# Patient Record
Sex: Male | Born: 1975 | Race: Black or African American | Hispanic: No | Marital: Single | Smoking: Never smoker
Health system: Southern US, Community
[De-identification: ages and names within clinical notes are randomized; demographics above are authoritative.]

## PROBLEM LIST (undated history)

## (undated) DIAGNOSIS — W3400XA Accidental discharge from unspecified firearms or gun, initial encounter: Secondary | ICD-10-CM

## (undated) DIAGNOSIS — G8929 Other chronic pain: Secondary | ICD-10-CM

## (undated) DIAGNOSIS — Z789 Other specified health status: Secondary | ICD-10-CM

## (undated) HISTORY — PX: ANKLE SURGERY: SHX546

## (undated) HISTORY — PX: WRIST SURGERY: SHX841

---

## 2018-12-17 ENCOUNTER — Other Ambulatory Visit: Payer: Self-pay

## 2018-12-17 ENCOUNTER — Ambulatory Visit: Payer: Managed Care, Other (non HMO) | Admitting: Nurse Practitioner

## 2018-12-19 ENCOUNTER — Ambulatory Visit (INDEPENDENT_AMBULATORY_CARE_PROVIDER_SITE_OTHER): Payer: Managed Care, Other (non HMO) | Admitting: Nurse Practitioner

## 2018-12-19 ENCOUNTER — Ambulatory Visit: Payer: Managed Care, Other (non HMO) | Admitting: Nurse Practitioner

## 2018-12-19 ENCOUNTER — Other Ambulatory Visit: Payer: Self-pay

## 2018-12-19 ENCOUNTER — Encounter: Payer: Self-pay | Admitting: Nurse Practitioner

## 2018-12-19 VITALS — BP 120/84 | HR 67 | Temp 98.1°F | Ht 75.0 in | Wt 205.2 lb

## 2018-12-19 DIAGNOSIS — M545 Low back pain, unspecified: Secondary | ICD-10-CM

## 2018-12-19 DIAGNOSIS — M25522 Pain in left elbow: Secondary | ICD-10-CM | POA: Diagnosis not present

## 2018-12-19 MED ORDER — DICLOFENAC SODIUM 1 % TD GEL: 2.0000 g | Freq: Three times a day (TID) | TRANSDERMAL | 1 refills | Status: DC | PRN

## 2018-12-19 MED ORDER — NAPROXEN 500 MG PO TABS: 500.0000 mg | ORAL_TABLET | Freq: Two times a day (BID) | ORAL | 0 refills | Status: DC

## 2018-12-19 NOTE — Patient Instructions (Signed)
Alternated between warm and cold compress as needed.  Use elbow brace daily.  Safe Lifting Techniques for Caregivers Being a caregiver can be physically difficult. It can also be dangerous for your back, neck, or shoulders. If you do not use safe lifting techniques, both you and the person you care for could be injured. You have a greater risk of injury when you:  Help a person sit up from lying down.  Help a person get out of bed.  Move a person from a bed to a chair or wheelchair.  Lean over someone for long periods of time. Learn and follow basic lifting techniques to prevent injuries. What are the risks? Without using safe lifting techniques, caregivers are at higher risk of injuring their back, neck, and shoulders. These injuries can be sudden and traumatic, or they can happen over time from repeating the same stressful motion over and over (overuse injuries). Overuse injuries are more common. These happen because muscles and joints do not have time to recover between repeated stressful movements. Using safe lifting techniques can help prevent injuries. General tips Before lifting 1. Stretch and warm up your muscles before lifting or moving. Cold and stiff muscles get injured more easily. 2. Talk to the person about what you are going to do. This may help him or her assist you with the lifting. While lifting 1. Always keep your head, shoulders, and back aligned. Twisting is a major cause of injury. 2. If you need to move while lifting, shift your feet in small steps while keeping your back straight. 3. Do not let a person you are lifting lock arms around your neck. This can make you lose your balance or fall forward, and can place strain on your neck. 4. Always keep your back as straight as possible. Bending at the waist or leaning over is a major cause of back injury. 5. When you are lifting a person or a heavy object, do not lift with your arms extended. Keep the person or object  close to your body. 6. Keep your feet spread shoulder-width apart, with one foot slightly in front of the other. This helps you maintain balance within your center of gravity when lifting. 7. Use the big muscles in your legs and arms instead of your back muscles to lift or pull. Tighten your belly muscles to help support your back. 8. If you have trouble lifting a person, or the person fights you or is stuck in a difficult position, do not try to lift alone. Get help. 9. If you feel pain while lifting, stop and get help. How to help someone sit up, stand up, or sit down Helping someone sit up, stand up, or sit down involves both lifting and turning. These movements require special techniques. Do not bend, twist, or lift with your back muscles. To help someone sit up to get out of bed: 1. Place the chair or wheelchair close to the bed. Make sure the wheelchair wheels are locked. 2. Place one arm under the person's shoulders and the other arm under the person's knees. 3. Keep your feet spread shoulder-width apart and bend at your knees, not your back. 4. Keep your back straight, and turn the person so that their legs come out over the bed and their upper body comes up straight into a sitting position. To help someone stand up from a bed, chair, or wheelchair:     1. Make sure the wheelchair wheels are locked. 2. Position the person's  feet on the floor spread shoulder-width apart. 3. Place the person's hands on the bed or arms of the chair, not around your neck. 4. Place your arms around the person's upper back and clasp your hands together. 5. Hold the person close to you, and lean your weight back. Use your legs while keeping your back straight. Avoid pulling with your back. 6. Consider using a lifting belt around the person's waist to make lifting safer and easier. To help someone sit down:  1. When someone is standing with you, support the person close to your body. 2. Turn to the chair or  wheelchair with small shuffling steps. 3. Keep your back straight. 4. Have the person put both hands on the arms of the chair, wheelchair, or surface they are going to sit on. 5. Lower the person into the chair by bending your knees, not by leaning forward. Contact a health care provider if:  You have pain while moving or lifting.  You have pain after moving or lifting.  You have pain while resting.  You have tingling, numbness, or shooting pain anywhere in your body.  You have bruising, swelling, or stiffness.  You have weakness that makes lifting hard. Summary  Being a caregiver for a person who needs help sitting up, getting out of bed, or moving from a bed to a chair can be physically difficult.  Lifting without proper technique can cause back, neck, and shoulder injuries.  Most injuries happen from bending over to give care, pulling someone up and out of bed, or moving someone from a bed to a chair.  Learn and follow basic lifting techniques to prevent injuries.  Do not bend, twist, or lift with your back muscles. This information is not intended to replace advice given to you by your health care provider. Make sure you discuss any questions you have with your health care provider. Document Released: 09/16/2018 Document Revised: 09/16/2018 Document Reviewed: 09/16/2018 Elsevier Patient Education  2020 [employer].

## 2018-12-19 NOTE — Progress Notes (Signed)
Subjective:  Patient ID: Tyler AddisonDwayne Rowland, male    DOB: 1976/03/03  Age: 43 y.o. MRN: [id number]  CC: Establish Care (est care/pt is c/o of lower left back pain when turn certain way,left arm painful when,worse when make a fis/ 1 mo/ibuprofen/ saw dcotor at [employer] for this before--his work consist of lifting heavy duties )  Back Pain This is a new problem. The current episode started 1 to 4 weeks ago. The problem occurs intermittently. The problem has been waxing and waning since onset. The pain is present in the lumbar spine. The quality of the pain is described as aching. The pain does not radiate. The symptoms are aggravated by bending and twisting. Pertinent negatives include no abdominal pain, bladder incontinence, bowel incontinence, dysuria, fever, leg pain, numbness, paresis, paresthesias, pelvic pain, perianal numbness, tingling, weakness or weight loss. Risk factors include poor posture (repititive bending and lifting at [employer]). He has tried NSAIDs for the symptoms. The treatment provided mild relief.  Arm Pain  Incident onset: 2months ago. Injury mechanism: repititive movement (lifting heavy objects at [facility] [employer]) The pain is present in the left forearm and left elbow. The quality of the pain is described as aching. The pain radiates to the left arm. The pain has been constant since the incident. Pertinent negatives include no muscle weakness, numbness or tingling. The symptoms are aggravated by movement and palpation. He has tried NSAIDs for the symptoms. The treatment provided mild relief.   Reviewed past Medical, Surgical, Social and Family history today.  No outpatient medications prior to visit.   No facility-administered medications prior to visit.     ROS See HPI  Objective:  BP 120/84   Pulse 67   Temp 98.1 F (36.7 C) (Oral)   Ht 6\' 3"  (1.905 m)   Wt 205 lb 3.2 oz (93.1 kg)   SpO2 96%   BMI 25.65 kg/m   BP  Readings from Last 3 Encounters:  12/19/18 120/84    Wt Readings from Last 3 Encounters:  12/19/18 205 lb 3.2 oz (93.1 kg)    Physical Exam Vitals signs reviewed.  Neck:     Musculoskeletal: Normal range of motion and neck supple.  Cardiovascular:     Rate and Rhythm: Normal rate.     Pulses: Normal pulses.  Musculoskeletal: Normal range of motion.        General: Tenderness present. No swelling.     Left elbow: He exhibits normal range of motion, no swelling, no effusion, no deformity and no laceration. Tenderness found. Lateral epicondyle tenderness noted. No radial head, no medial epicondyle and no olecranon process tenderness noted.     Left wrist: Normal.       Back:     Left forearm: He exhibits tenderness. He exhibits no bony tenderness, no swelling, no edema and no deformity.       Arms:     Left hand: Normal.     Right lower leg: No edema.     Left lower leg: No edema.  Skin:    General: Skin is warm and dry.  Neurological:     Mental Status: He is alert and oriented to person, place, and time.    No results found for: WBC, HGB, HCT, PLT, GLUCOSE, CHOL, TRIG, HDL, LDLDIRECT, LDLCALC, ALT, AST, NA, K, CL, CREATININE, BUN, CO2, TSH, PSA, INR, GLUF, HGBA1C, MICROALBUR  Assessment & Plan:   Tyler was seen today for establish care.  Diagnoses and  all orders for this visit:  Acute left-sided low back pain without sciatica -     diclofenac sodium (VOLTAREN) 1 % GEL; Apply 2 g topically 3 (three) times daily as needed. -     naproxen (NAPROSYN) 500 MG tablet; Take 1 tablet (500 mg total) by mouth 2 (two) times daily with a meal.  Left elbow pain -     diclofenac sodium (VOLTAREN) 1 % GEL; Apply 2 g topically 3 (three) times daily as needed. -     naproxen (NAPROSYN) 500 MG tablet; Take 1 tablet (500 mg total) by mouth 2 (two) times daily with a meal.   I am having Tyler Londo start on diclofenac sodium and naproxen.  Meds ordered this encounter  Medications   . diclofenac sodium (VOLTAREN) 1 % GEL    Sig: Apply 2 g topically 3 (three) times daily as needed.    Dispense:  100 g    Refill:  1    Order Specific Question:   Supervising Provider    Answer:   MATTHEWS, CODY [[id number]]  . naproxen (NAPROSYN) 500 MG tablet    Sig: Take 1 tablet (500 mg total) by mouth 2 (two) times daily with a meal.    Dispense:  30 tablet    Refill:  0    Order Specific Question:   Supervising Provider    Answer:   MATTHEWS, CODY [[id number]]    Problem List Items Addressed This Visit    None    Visit Diagnoses    Acute left-sided low back pain without sciatica    -  Primary   Relevant Medications   diclofenac sodium (VOLTAREN) 1 % GEL   naproxen (NAPROSYN) 500 MG tablet   Left elbow pain       Relevant Medications   diclofenac sodium (VOLTAREN) 1 % GEL   naproxen (NAPROSYN) 500 MG tablet       Follow-up: Return in about 2 weeks (around 01/02/2019) for CPE (fasting).  Wilfred Lacy, NP

## 2018-12-31 ENCOUNTER — Ambulatory Visit (INDEPENDENT_AMBULATORY_CARE_PROVIDER_SITE_OTHER): Payer: Managed Care, Other (non HMO) | Admitting: Nurse Practitioner

## 2018-12-31 ENCOUNTER — Other Ambulatory Visit: Payer: Self-pay

## 2018-12-31 ENCOUNTER — Encounter: Payer: Self-pay | Admitting: Nurse Practitioner

## 2018-12-31 VITALS — BP 126/86 | HR 70 | Temp 98.1°F | Ht 75.0 in | Wt 211.6 lb

## 2018-12-31 DIAGNOSIS — Z114 Encounter for screening for human immunodeficiency virus [HIV]: Secondary | ICD-10-CM | POA: Diagnosis not present

## 2018-12-31 DIAGNOSIS — Z136 Encounter for screening for cardiovascular disorders: Secondary | ICD-10-CM

## 2018-12-31 DIAGNOSIS — Z0000 Encounter for general adult medical examination without abnormal findings: Secondary | ICD-10-CM | POA: Diagnosis not present

## 2018-12-31 DIAGNOSIS — Z125 Encounter for screening for malignant neoplasm of prostate: Secondary | ICD-10-CM

## 2018-12-31 DIAGNOSIS — Z13220 Encounter for screening for lipoid disorders: Secondary | ICD-10-CM

## 2018-12-31 LAB — COMPREHENSIVE METABOLIC PANEL
ALT: 15 U/L (ref 0–53)
AST: 15 U/L (ref 0–37)
Albumin: 4.4 g/dL (ref 3.5–5.2)
Alkaline Phosphatase: 59 U/L (ref 39–117)
BUN: 10 mg/dL (ref 6–23)
CO2: 30 mEq/L (ref 19–32)
Calcium: 9.4 mg/dL (ref 8.4–10.5)
Chloride: 105 mEq/L (ref 96–112)
Creatinine, Ser: 1.21 mg/dL (ref 0.40–1.50)
GFR: 78.5 mL/min (ref >60.00)
Glucose, Bld: 99 mg/dL (ref 70–99)
Potassium: 4.5 mEq/L (ref 3.5–5.1)
Sodium: 140 mEq/L (ref 135–145)
Total Bilirubin: 0.3 mg/dL (ref 0.2–1.2)
Total Protein: 6.8 g/dL (ref 6.0–8.3)

## 2018-12-31 LAB — CBC
HCT: 43.7 % (ref 39.0–52.0)
Hemoglobin: 14.4 g/dL (ref 13.0–17.0)
MCHC: 32.9 g/dL (ref 30.0–36.0)
MCV: 90.8 fl (ref 78.0–100.0)
Platelets: 237 10*3/uL (ref 150.0–400.0)
RBC: 4.81 Mil/uL (ref 4.22–5.81)
RDW: 13.6 % (ref 11.5–15.5)
WBC: 3.3 10*3/uL — ABNORMAL LOW (ref 4.0–10.5)

## 2018-12-31 LAB — LIPID PANEL
Cholesterol: 201 mg/dL — ABNORMAL HIGH (ref 0–200)
HDL: 49.2 mg/dL (ref >39.00)
LDL Cholesterol: 139 mg/dL — ABNORMAL HIGH (ref 0–99)
NonHDL: 151.47
Total CHOL/HDL Ratio: 4
Triglycerides: 63 mg/dL (ref 0.0–149.0)
VLDL: 12.6 mg/dL (ref 0.0–40.0)

## 2018-12-31 LAB — PSA: PSA: 0.92 ng/mL (ref 0.10–4.00)

## 2018-12-31 LAB — TSH: TSH: 1.07 u[IU]/mL (ref 0.35–4.50)

## 2018-12-31 NOTE — Progress Notes (Signed)
Subjective:    Patient ID: Tyler AddisonDwayne Rowland, male    DOB: [birth year], 43 y.o.   MRN: [id number]  Patient presents today for complete physical  HPI  Sexual History (orientation,birth control, marital status, STD):single, sexually active  Depression/Suicide: [facility] screen [facility] 12/19/2018  Decreased Interest 0  Down, Depressed, Hopeless 0  PHQ - 2 Score 0   Vision:not needed per patient  Dental:will schedule  Immunizations: (TDAP, Hep C screen, Pneumovax, Influenza, zoster)  Health Maintenance  Topic Date Due  . HIV Screening  02/07/1989  . Flu Shot  01/10/2019  . Tetanus Vaccine  01/09/2026   Diet:regular.  Weight:  Wt Readings from Last 3 Encounters:  12/31/18 211 lb 9.6 oz (96 kg)  12/19/18 205 lb 3.2 oz (93.1 kg)    Exercise:none outside of lifting/pushing/walking at current job ([employer])  Fall Risk: Fall Risk  12/19/2018  Falls in the past year? 0   Medications and allergies reviewed with patient and updated if appropriate.  There are no active problems to display for this patient.   Current Outpatient Medications on File Prior to Visit  Medication Sig Dispense Refill  . diclofenac sodium (VOLTAREN) 1 % GEL Apply 2 g topically 3 (three) times daily as needed. 100 g 1  . naproxen (NAPROSYN) 500 MG tablet Take 1 tablet (500 mg total) by mouth 2 (two) times daily with a meal. 30 tablet 0   No current facility-administered medications on file prior to visit.     History reviewed. No pertinent past medical history.  Past Surgical History:  Procedure Laterality Date  . ANKLE SURGERY Left   . WRIST SURGERY Right     Social History   Socioeconomic History  . Marital status: Single    Spouse name: Not on file  . Number of children: Not on file  . Years of education: Not on file  . Highest education level: Not on file  Occupational History  . Not on file  Social Needs  . Financial resource strain: Not on file  . Food insecurity    Worry: Not on file     Inability: Not on file  . Transportation needs    Medical: Not on file    Non-medical: Not on file  Tobacco Use  . Smoking status: Never Smoker  . Smokeless tobacco: Never Used  Substance and Sexual Activity  . Alcohol use: Yes    Comment: social   . Drug use: Never  . Sexual activity: Yes    Birth control/protection: None  Lifestyle  . Physical activity    Days per week: Not on file    Minutes per session: Not on file  . Stress: Not on file  Relationships  . Social Musicianconnections    Talks on phone: Not on file    Gets together: Not on file    Attends religious service: Not on file    Active member of club or organization: Not on file    Attends meetings of clubs or organizations: Not on file    Relationship status: Not on file  Other Topics Concern  . Not on file  Social History Narrative  . Not on file    History reviewed. No pertinent family history.      Review of Systems  Constitutional: Negative for fever, malaise/fatigue and weight loss.  HENT: Negative for congestion and sore throat.   Eyes:       Negative for visual changes  Respiratory: Negative for cough and shortness of  breath.   Cardiovascular: Negative for chest pain, palpitations and leg swelling.  Gastrointestinal: Negative for blood in stool, constipation, diarrhea and heartburn.  Genitourinary: Negative for dysuria, frequency and urgency.  Musculoskeletal: Negative for falls, joint pain and myalgias.  Skin: Negative for rash.  Neurological: Negative for dizziness, sensory change and headaches.  Endo/Heme/Allergies: Does not bruise/bleed easily.  Psychiatric/Behavioral: Negative for depression, substance abuse and suicidal ideas. The patient is not nervous/anxious.     Objective:   Vitals:   12/31/18 0912  BP: 126/86  Pulse: 70  Temp: 98.1 F (36.7 C)  SpO2: 96%    Body mass index is 26.45 kg/m.   Physical Examination:  Physical Exam Vitals signs reviewed.  Constitutional:       Appearance: Normal appearance. He is well-developed.  HENT:     Head: Normocephalic.     Right Ear: Tympanic membrane, ear canal and external ear normal.     Left Ear: Tympanic membrane, ear canal and external ear normal.     Nose: Nose normal.     Mouth/Throat:     Pharynx: No oropharyngeal exudate.  Eyes:     Extraocular Movements: Extraocular movements intact.     Conjunctiva/sclera: Conjunctivae normal.  Neck:     Musculoskeletal: Normal range of motion and neck supple.  Cardiovascular:     Rate and Rhythm: Normal rate and regular rhythm.     Heart sounds: Normal heart sounds.  Pulmonary:     Effort: Pulmonary effort is normal. No respiratory distress.     Breath sounds: Normal breath sounds.  Chest:     Chest wall: No tenderness.  Abdominal:     General: Abdomen is flat. Bowel sounds are normal.     Palpations: Abdomen is soft.  Genitourinary:    Comments: declined Musculoskeletal: Normal range of motion.  Skin:    General: Skin is warm and dry.  Neurological:     Mental Status: He is alert and oriented to person, place, and time.     Deep Tendon Reflexes: Reflexes are normal and symmetric.  Psychiatric:        Mood and Affect: Mood normal.        Behavior: Behavior normal.        Thought Content: Thought content normal.     ASSESSMENT and PLAN:  Tyler was seen today for annual exam.  Diagnoses and all orders for this visit:  Encounter for preventative adult health care examination -     TSH -     CBC -     Lipid panel -     HIV Antibody (routine testing w rflx) -     Comprehensive metabolic panel -     PSA  Encounter for lipid screening for cardiovascular disease -     Lipid panel  Prostate cancer screening -     PSA  Encounter for screening for human immunodeficiency virus (HIV) -     HIV Antibody (routine testing w rflx)   No problem-specific Assessment & Plan notes found for this encounter.     Problem List Items Addressed This Visit     None    Visit Diagnoses    Encounter for preventative adult health care examination    -  Primary   Relevant Orders   TSH (Completed)   CBC (Completed)   Lipid panel (Completed)   HIV Antibody (routine testing w rflx)   Comprehensive metabolic panel (Completed)   PSA (Completed)   Encounter for lipid screening for  cardiovascular disease       Relevant Orders   Lipid panel (Completed)   Prostate cancer screening       Relevant Orders   PSA (Completed)   Encounter for screening for human immunodeficiency virus (HIV)       Relevant Orders   HIV Antibody (routine testing w rflx)       Follow up: Return if symptoms worsen or fail to improve.  Wilfred Lacy, NP

## 2018-12-31 NOTE — Patient Instructions (Signed)
Use naproxen as prescribed.  Schedule dental cleaning every 42months. Schedule eye exam every 1-14yrs.  Go to lab for blood draw.   Health Maintenance, Male Adopting a healthy lifestyle and getting preventive care are important in promoting health and wellness. Ask your health care provider about:  The right schedule for you to have regular tests and exams.  Things you can do on your own to prevent diseases and keep yourself healthy. What should I know about diet, weight, and exercise? Eat a healthy diet   Eat a diet that includes plenty of vegetables, fruits, low-fat dairy products, and lean protein.  Do not eat a lot of foods that are high in solid fats, added sugars, or sodium. Maintain a healthy weight Body mass index (BMI) is a measurement that can be used to identify possible weight problems. It estimates body fat based on height and weight. Your health care provider can help determine your BMI and help you achieve or maintain a healthy weight. Get regular exercise Get regular exercise. This is one of the most important things you can do for your health. Most adults should:  Exercise for at least 150 minutes each week. The exercise should increase your heart rate and make you sweat (moderate-intensity exercise).  Do strengthening exercises at least twice a week. This is in addition to the moderate-intensity exercise.  Spend less time sitting. Even light physical activity can be beneficial. Watch cholesterol and blood lipids Have your blood tested for lipids and cholesterol at 45 years of age, then have this test every 5 years. You may need to have your cholesterol levels checked more often if:  Your lipid or cholesterol levels are high.  You are older than 45 years of age.  You are at high risk for heart disease. What should I know about cancer screening? Many types of cancers can be detected early and may often be prevented. Depending on your health history and family  history, you may need to have cancer screening at various ages. This may include screening for:  Colorectal cancer.  Prostate cancer.  Skin cancer.  Lung cancer. What should I know about heart disease, diabetes, and high blood pressure? Blood pressure and heart disease  High blood pressure causes heart disease and increases the risk of stroke. This is more likely to develop in people who have high blood pressure readings, are of African descent, or are overweight.  Talk with your health care provider about your target blood pressure readings.  Have your blood pressure checked: ? Every 3-5 years if you are 39-67 years of age. ? Every year if you are 56 years old or older.  If you are between the ages of 29 and 28 and are a current or former smoker, ask your health care provider if you should have a one-time screening for abdominal aortic aneurysm (AAA). Diabetes Have regular diabetes screenings. This checks your fasting blood sugar level. Have the screening done:  Once every three years after age 82 if you are at a normal weight and have a low risk for diabetes.  More often and at a younger age if you are overweight or have a high risk for diabetes. What should I know about preventing infection? Hepatitis B If you have a higher risk for hepatitis B, you should be screened for this virus. Talk with your health care provider to find out if you are at risk for hepatitis B infection. Hepatitis C Blood testing is recommended for:  Everyone born  from [place] through 1967.  Anyone with known risk factors for hepatitis C. Sexually transmitted infections (STIs)  You should be screened each year for STIs, including gonorrhea and chlamydia, if: ? You are sexually active and are younger than 45 years of age. ? You are older than 45 years of age and your health care provider tells you that you are at risk for this type of infection. ? Your sexual activity has changed since you were last  screened, and you are at increased risk for chlamydia or gonorrhea. Ask your health care provider if you are at risk.  Ask your health care provider about whether you are at high risk for HIV. Your health care provider may recommend a prescription medicine to help prevent HIV infection. If you choose to take medicine to prevent HIV, you should first get tested for HIV. You should then be tested every 3 months for as long as you are taking the medicine. Follow these instructions at home: Lifestyle  Do not use any products that contain nicotine or tobacco, such as cigarettes, e-cigarettes, and chewing tobacco. If you need help quitting, ask your health care provider.  Do not use street drugs.  Do not share needles.  Ask your health care provider for help if you need support or information about quitting drugs. Alcohol use  Do not drink alcohol if your health care provider tells you not to drink.  If you drink alcohol: ? Limit how much you have to 0-2 drinks a day. ? Be aware of how much alcohol is in your drink. In the [place] one drink equals one 12 oz bottle of beer (355 mL), one 5 oz glass of wine (148 mL), or one 1 oz glass of hard liquor (44 mL). General instructions  Schedule regular health, dental, and eye exams.  Stay current with your vaccines.  Tell your health care provider if: ? You often feel depressed. ? You have ever been abused or do not feel safe at home. Summary  Adopting a healthy lifestyle and getting preventive care are important in promoting health and wellness.  Follow your health care provider's instructions about healthy diet, exercising, and getting tested or screened for diseases.  Follow your health care provider's instructions on monitoring your cholesterol and blood pressure. This information is not intended to replace advice given to you by your health care provider. Make sure you discuss any questions you have with your health care provider. Document  Released: 11/24/2007 Document Revised: 05/21/2018 Document Reviewed: 05/21/2018 Elsevier Patient Education  2020 [employer].

## 2019-01-01 LAB — HIV ANTIBODY (ROUTINE TESTING W REFLEX): HIV 1&2 Ab, 4th Generation: NONREACTIVE

## 2019-02-06 ENCOUNTER — Encounter (HOSPITAL_COMMUNITY): Payer: Self-pay

## 2019-02-06 ENCOUNTER — Emergency Department (HOSPITAL_COMMUNITY): Payer: Managed Care, Other (non HMO)

## 2019-02-06 ENCOUNTER — Other Ambulatory Visit: Payer: Self-pay

## 2019-02-06 ENCOUNTER — Inpatient Hospital Stay (HOSPITAL_COMMUNITY)
Admission: EM | Admit: 2019-02-06 | Discharge: 2019-02-11 | DRG: 907 | Disposition: A | Discharge status: Home / Self Care | Payer: Managed Care, Other (non HMO) | Attending: Vascular Surgery | Admitting: Vascular Surgery

## 2019-02-06 DIAGNOSIS — S75012A Minor laceration of femoral artery, left leg, initial encounter: Secondary | ICD-10-CM | POA: Diagnosis not present

## 2019-02-06 DIAGNOSIS — Z20828 Contact with and (suspected) exposure to other viral communicable diseases: Secondary | ICD-10-CM | POA: Diagnosis present

## 2019-02-06 DIAGNOSIS — Y92810 Car as the place of occurrence of the external cause: Secondary | ICD-10-CM

## 2019-02-06 DIAGNOSIS — D62 Acute posthemorrhagic anemia: Secondary | ICD-10-CM | POA: Diagnosis present

## 2019-02-06 DIAGNOSIS — S71132A Puncture wound without foreign body, left thigh, initial encounter: Secondary | ICD-10-CM | POA: Diagnosis not present

## 2019-02-06 DIAGNOSIS — I9589 Other hypotension: Secondary | ICD-10-CM

## 2019-02-06 DIAGNOSIS — Y92410 Unspecified street and highway as the place of occurrence of the external cause: Secondary | ICD-10-CM

## 2019-02-06 DIAGNOSIS — S85002A Unspecified injury of popliteal artery, left leg, initial encounter: Secondary | ICD-10-CM

## 2019-02-06 DIAGNOSIS — S75112A Minor laceration of femoral vein at hip and thigh level, left leg, initial encounter: Secondary | ICD-10-CM | POA: Diagnosis present

## 2019-02-06 DIAGNOSIS — I959 Hypotension, unspecified: Secondary | ICD-10-CM | POA: Diagnosis present

## 2019-02-06 DIAGNOSIS — W3400XA Accidental discharge from unspecified firearms or gun, initial encounter: Secondary | ICD-10-CM

## 2019-02-06 HISTORY — DX: Other specified health status: Z78.9

## 2019-02-06 LAB — COMPREHENSIVE METABOLIC PANEL
ALT: 17 U/L (ref 0–44)
AST: 23 U/L (ref 15–41)
Albumin: 3.7 g/dL (ref 3.5–5.0)
Alkaline Phosphatase: 64 U/L (ref 38–126)
Anion gap: 12 (ref 5–15)
BUN: 9 mg/dL (ref 6–20)
CO2: 19 mmol/L — ABNORMAL LOW (ref 22–32)
Calcium: 8.5 mg/dL — ABNORMAL LOW (ref 8.9–10.3)
Chloride: 108 mmol/L (ref 98–111)
Creatinine, Ser: 1.63 mg/dL — ABNORMAL HIGH (ref 0.61–1.24)
GFR calc Af Amer: 59 mL/min — ABNORMAL LOW (ref >60)
GFR calc non Af Amer: 50 mL/min — ABNORMAL LOW (ref >60)
Glucose, Bld: 165 mg/dL — ABNORMAL HIGH (ref 70–99)
Potassium: 4.4 mmol/L (ref 3.5–5.1)
Sodium: 139 mmol/L (ref 135–145)
Total Bilirubin: 0.7 mg/dL (ref 0.3–1.2)
Total Protein: 6.3 g/dL — ABNORMAL LOW (ref 6.5–8.1)

## 2019-02-06 LAB — BPAM FFP
Blood Product Expiration Date: 202008312359
Blood Product Expiration Date: 202008312359
ISSUE DATE / TIME: 202008282024
ISSUE DATE / TIME: 202008282024
Unit Type and Rh: 8400
Unit Type and Rh: 8400

## 2019-02-06 LAB — PREPARE FRESH FROZEN PLASMA: Unit division: 0

## 2019-02-06 LAB — CBC
HCT: 39.8 % (ref 39.0–52.0)
Hemoglobin: 13 g/dL (ref 13.0–17.0)
MCH: 29.6 pg (ref 26.0–34.0)
MCHC: 32.7 g/dL (ref 30.0–36.0)
MCV: 90.7 fL (ref 80.0–100.0)
Platelets: 310 10*3/uL (ref 150–400)
RBC: 4.39 MIL/uL (ref 4.22–5.81)
RDW: 12.3 % (ref 11.5–15.5)
WBC: 4.3 10*3/uL (ref 4.0–10.5)
nRBC: 0 % (ref 0.0–0.2)

## 2019-02-06 LAB — I-STAT CHEM 8, ED
BUN: 9 mg/dL (ref 6–20)
Calcium, Ion: 1.08 mmol/L — ABNORMAL LOW (ref 1.15–1.40)
Chloride: 109 mmol/L (ref 98–111)
Creatinine, Ser: 1.6 mg/dL — ABNORMAL HIGH (ref 0.61–1.24)
Glucose, Bld: 159 mg/dL — ABNORMAL HIGH (ref 70–99)
HCT: 39 % (ref 39.0–52.0)
Hemoglobin: 13.3 g/dL (ref 13.0–17.0)
Potassium: 4.3 mmol/L (ref 3.5–5.1)
Sodium: 142 mmol/L (ref 135–145)
TCO2: 20 mmol/L — ABNORMAL LOW (ref 22–32)

## 2019-02-06 LAB — PROTIME-INR
INR: 1.1 (ref 0.8–1.2)
Prothrombin Time: 13.7 seconds (ref 11.4–15.2)

## 2019-02-06 LAB — CDS SEROLOGY

## 2019-02-06 LAB — ABO/RH: ABO/RH(D): A POS

## 2019-02-06 LAB — ETHANOL: Alcohol, Ethyl (B): <10 mg/dL (ref <10)

## 2019-02-06 MED ORDER — IOHEXOL 350 MG/ML SOLN
100.0000 mL | Freq: Once | INTRAVENOUS | Status: AC | PRN
  Administered 2019-02-06: 100 mL via INTRAVENOUS

## 2019-02-06 MED ORDER — CEFAZOLIN SODIUM-DEXTROSE 2-4 GM/100ML-% IV SOLN
2.0000 g | Freq: Once | INTRAVENOUS | Status: AC
  Administered 2019-02-06: 21:00:00 2 g via INTRAVENOUS
  Filled 2019-02-06: qty 100

## 2019-02-06 MED ORDER — CEPHALEXIN 500 MG PO CAPS: 500.0000 mg | ORAL_CAPSULE | Freq: Three times a day (TID) | ORAL | 0 refills | Status: DC

## 2019-02-06 MED ORDER — TETANUS-DIPHTH-ACELL PERTUSSIS 5-2.5-18.5 LF-MCG/0.5 IM SUSP
0.5000 mL | Freq: Once | INTRAMUSCULAR | Status: AC
  Administered 2019-02-06: 21:00:00 0.5 mL via INTRAMUSCULAR
  Filled 2019-02-06: qty 0.5

## 2019-02-06 MED ORDER — ONDANSETRON HCL 4 MG/2ML IJ SOLN
4.0000 mg | Freq: Once | INTRAMUSCULAR | Status: AC
  Administered 2019-02-06: 4 mg via INTRAVENOUS
  Filled 2019-02-06: qty 2

## 2019-02-06 MED ORDER — SODIUM CHLORIDE 0.9 % IV BOLUS
1000.0000 mL | Freq: Once | INTRAVENOUS | Status: AC
  Administered 2019-02-06: 1000 mL via INTRAVENOUS

## 2019-02-06 MED ORDER — HYDROMORPHONE HCL 1 MG/ML IJ SOLN
1.0000 mg | Freq: Once | INTRAMUSCULAR | Status: AC
  Administered 2019-02-06: 1 mg via INTRAVENOUS
  Filled 2019-02-06: qty 1

## 2019-02-06 MED ORDER — MORPHINE SULFATE (PF) 4 MG/ML IV SOLN
4.0000 mg | Freq: Once | INTRAVENOUS | Status: AC
  Administered 2019-02-06: 4 mg via INTRAVENOUS
  Filled 2019-02-06: qty 1

## 2019-02-06 NOTE — ED Notes (Signed)
Need to collect lactic acid plasma 
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
## 2019-02-06 NOTE — ED Provider Notes (Signed)
At signout, f/u on Ct imaging If negative he can be discharged If abnormal, consult trauma    Ripley Fraise, MD 02/06/19 2340
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
## 2019-02-06 NOTE — Discharge Instructions (Addendum)
° °[facility] ° °Discharge instructions ° °Lower Extremity Bypass Surgery ° °Please refer to the following instruction for your post-procedure care. Your surgeon or physician assistant will discuss any changes with you. ° °Activity ° °You are encouraged to walk as much as you can. You can slowly return to normal activities during the month after your surgery. Avoid strenuous activity and heavy lifting until your doctor tells you it's OK. Avoid activities such as vacuuming or swinging a golf club. Do not drive until your doctor give the OK and you are no longer taking prescription pain medications. It is also normal to have difficulty with sleep habits, eating and bowel movement after surgery. These will go away with time. ° °Bathing/Showering ° °Shower daily after you go home. Do not soak in a bathtub, hot tub, or swim until the incision heals completely. ° °Incision Care ° °Clean your incision with mild soap and water. Shower every day. Pat the area dry with a clean towel. You do not need a bandage unless otherwise instructed. Do not apply any ointments or creams to your incision. If you have open wounds you will be instructed how to care for them or a visiting nurse may be arranged for you. If you have staples or sutures along your incision they will be removed at your post-op appointment. You may have skin glue on your incision. Do not peel it off. It will come off on its own in about one week. ° °Wash the groin wound with soap and water daily and pat dry. (No tub bath-only shower)  Then put a dry gauze or washcloth in the groin to keep this area dry to help prevent wound infection.  Do this daily and as needed.  Do not use Vaseline or neosporin on your incisions.  Only use soap and water on your incisions and then protect and keep dry. ° °Diet ° °Resume your normal diet. There are no special food restrictions following this procedure. A low fat/ low cholesterol diet is  recommended for all patients with vascular disease. In order to heal from your surgery, it is CRITICAL to get adequate nutrition. Your body requires vitamins, minerals, and protein. Vegetables are the best source of vitamins and minerals. Vegetables also provide the perfect balance of protein. Processed food has little nutritional value, so try to avoid this. ° °Medications ° °Resume taking all your medications unless your doctor or physician assistant tells you not to. If your incision is causing pain, you may take over-the-counter pain relievers such as acetaminophen (Tylenol). If you were prescribed a stronger pain medication, please aware these medication can cause nausea and constipation. Prevent nausea by taking the medication with a snack or meal. Avoid constipation by drinking plenty of fluids and eating foods with high amount of fiber, such as fruits, vegetables, and grains. Take Colace 100 mg (an over-the-counter stool softener) twice a day as needed for constipation.  °Do not take Tylenol if you are taking prescription pain medications. ° °Follow Up ° °Our office will schedule a follow up appointment 2-3 weeks following discharge. ° °Please call us immediately for any of the following conditions ° °•Severe or worsening pain in your legs or feet while at rest or while walking •Increase pain, redness, warmth, or drainage (pus) from your incision site(s) °Fever of 101 degree or higher °The swelling in your leg with the bypass suddenly worsens and becomes more painful than when you were in the hospital °If you have   been instructed to feel your graft pulse then you should do so every day. If you can no longer feel this pulse, call the office immediately. Not all patients are given this instruction.  Leg swelling is common after leg bypass surgery.  The swelling should improve over a few months following surgery. To improve the swelling, you may elevate your legs above the level of your heart while you are  sitting or resting. Your surgeon or physician assistant may ask you to apply an ACE wrap or wear compression (TED) stockings to help to reduce swelling.  Reduce your risk of vascular disease  Stop smoking. If you would like help call [facility] at 1-800-QUIT-NOW 782-459-9054) or [facility] at [phone].  Manage your cholesterol Maintain a desired weight Control your diabetes weight Control your diabetes Keep your blood pressure down  If you have any questions, please call the office at [phone]  Information on my medicine - XARELTO (rivaroxaban)  This medication education was reviewed with me or my healthcare representative as part of my discharge preparation.  The pharmacist that spoke with me during my hospital stay was:  Tad Moore, [place]? Xarelto was prescribed to treat blood clots that may have been found in the veins of your legs (deep vein thrombosis) or in your lungs (pulmonary embolism) and to reduce the risk of them occurring again.  What do you need to know about Xarelto? The starting dose is one 15 mg tablet taken TWICE daily with food for the FIRST 21 DAYS then the dose is changed to one 20 mg tablet taken ONCE A DAY with your evening meal.  DO NOT stop taking Xarelto without talking to the health care provider who prescribed the medication.  Refill your prescription for 20 mg tablets before you run out.  After discharge, you should have regular check-up appointments with your healthcare provider that is prescribing your Xarelto.  In the future your dose may need to be changed if your kidney function changes by a significant amount.  What do you do if you miss a dose? If you are taking Xarelto TWICE DAILY and you miss a dose, take it as soon as you remember. You may take two 15 mg tablets (total 30 mg) at the same time then resume your regularly scheduled 15 mg twice daily the next day.  If you are taking Xarelto  ONCE DAILY and you miss a dose, take it as soon as you remember on the same day then continue your regularly scheduled once daily regimen the next day. Do not take two doses of Xarelto at the same time.   Important Safety Information Xarelto is a blood thinner medicine that can cause bleeding. You should call your healthcare provider right away if you experience any of the following: ? Bleeding from an injury or your nose that does not stop. ? Unusual colored urine (red or dark brown) or unusual colored stools (red or black). ? Unusual bruising for unknown reasons. ? A serious fall or if you hit your head (even if there is no bleeding).  Some medicines may interact with Xarelto and might increase your risk of bleeding while on Xarelto. To help avoid this, consult your healthcare provider or pharmacist prior to using any new prescription or non-prescription medications, including herbals, vitamins, non-steroidal anti-inflammatory drugs (NSAIDs) and supplements.  This website has more information on Xarelto: [email or web address]

## 2019-02-06 NOTE — ED Notes (Signed)
Attempted to irrigate wound and dress per order, on lifting leg to apply dsg and cleanse area, pt noted to have about 300 cc of EBL on chux pad, actively streaming blood from wound to chux w/ additional estimated 200-300 additional mL of blood loss. Pt denies dizziness, nausea, lightheadedness. Pressure remains stable, MD Steinl made aware.
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
## 2019-02-07 ENCOUNTER — Emergency Department (HOSPITAL_COMMUNITY): Payer: Managed Care, Other (non HMO) | Admitting: Certified Registered"

## 2019-02-07 ENCOUNTER — Encounter (HOSPITAL_COMMUNITY)
Admission: EM | Disposition: A | Discharge status: Home / Self Care | Payer: Self-pay | Source: Home / Self Care | Attending: Vascular Surgery

## 2019-02-07 DIAGNOSIS — W3400XA Accidental discharge from unspecified firearms or gun, initial encounter: Secondary | ICD-10-CM | POA: Diagnosis not present

## 2019-02-07 DIAGNOSIS — Y92410 Unspecified street and highway as the place of occurrence of the external cause: Secondary | ICD-10-CM | POA: Diagnosis not present

## 2019-02-07 DIAGNOSIS — D62 Acute posthemorrhagic anemia: Secondary | ICD-10-CM | POA: Diagnosis present

## 2019-02-07 DIAGNOSIS — S75191A Other specified injury of femoral vein at hip and thigh level, right leg, initial encounter: Secondary | ICD-10-CM

## 2019-02-07 DIAGNOSIS — Z20828 Contact with and (suspected) exposure to other viral communicable diseases: Secondary | ICD-10-CM | POA: Diagnosis present

## 2019-02-07 DIAGNOSIS — S71132A Puncture wound without foreign body, left thigh, initial encounter: Secondary | ICD-10-CM | POA: Diagnosis present

## 2019-02-07 DIAGNOSIS — Y92810 Car as the place of occurrence of the external cause: Secondary | ICD-10-CM | POA: Diagnosis not present

## 2019-02-07 DIAGNOSIS — S75091A Other specified injury of femoral artery, right leg, initial encounter: Secondary | ICD-10-CM

## 2019-02-07 DIAGNOSIS — S75112A Minor laceration of femoral vein at hip and thigh level, left leg, initial encounter: Secondary | ICD-10-CM | POA: Diagnosis present

## 2019-02-07 DIAGNOSIS — I959 Hypotension, unspecified: Secondary | ICD-10-CM | POA: Diagnosis present

## 2019-02-07 DIAGNOSIS — S85092A Other specified injury of popliteal artery, left leg, initial encounter: Secondary | ICD-10-CM | POA: Diagnosis not present

## 2019-02-07 DIAGNOSIS — S75012A Minor laceration of femoral artery, left leg, initial encounter: Secondary | ICD-10-CM | POA: Diagnosis present

## 2019-02-07 DIAGNOSIS — S75192A Other specified injury of femoral vein at hip and thigh level, left leg, initial encounter: Secondary | ICD-10-CM

## 2019-02-07 HISTORY — PX: FASCIOTOMY: SHX132

## 2019-02-07 HISTORY — PX: APPLICATION OF WOUND VAC: SHX5189

## 2019-02-07 HISTORY — PX: FEMORAL ARTERY EXPLORATION: SHX5160

## 2019-02-07 HISTORY — PX: VEIN HARVEST: SHX6363

## 2019-02-07 LAB — CBC
HCT: 37.3 % — ABNORMAL LOW (ref 39.0–52.0)
Hemoglobin: 11.8 g/dL — ABNORMAL LOW (ref 13.0–17.0)
MCH: 29.4 pg (ref 26.0–34.0)
MCHC: 31.6 g/dL (ref 30.0–36.0)
MCV: 92.8 fL (ref 80.0–100.0)
Platelets: 216 10*3/uL (ref 150–400)
RBC: 4.02 MIL/uL — ABNORMAL LOW (ref 4.22–5.81)
RDW: 12.7 % (ref 11.5–15.5)
WBC: 9.3 10*3/uL (ref 4.0–10.5)
nRBC: 0 % (ref 0.0–0.2)

## 2019-02-07 LAB — HEPARIN LEVEL (UNFRACTIONATED): Heparin Unfractionated: 0.51 IU/mL (ref 0.30–0.70)

## 2019-02-07 LAB — BLOOD PRODUCT ORDER (VERBAL) VERIFICATION

## 2019-02-07 LAB — APTT: aPTT: 59 seconds — ABNORMAL HIGH (ref 24–36)

## 2019-02-07 LAB — SARS CORONAVIRUS 2 BY RT PCR (HOSPITAL ORDER, PERFORMED IN ~~LOC~~ HOSPITAL LAB): SARS Coronavirus 2: NEGATIVE

## 2019-02-07 SURGERY — EXPLORATION, ARTERY, FEMORAL
Anesthesia: General | Laterality: Right

## 2019-02-07 MED ORDER — CEFAZOLIN SODIUM-DEXTROSE 2-3 GM-%(50ML) IV SOLR
INTRAVENOUS | Status: DC | PRN
  Administered 2019-02-07: 2 g via INTRAVENOUS

## 2019-02-07 MED ORDER — MAGNESIUM SULFATE 2 GM/50ML IV SOLN: 2.0000 g | Freq: Every day | INTRAVENOUS | Status: DC | PRN

## 2019-02-07 MED ORDER — GUAIFENESIN-DM 100-10 MG/5ML PO SYRP: 15.0000 mL | ORAL_SOLUTION | ORAL | Status: DC | PRN

## 2019-02-07 MED ORDER — LABETALOL HCL 5 MG/ML IV SOLN: 10.0000 mg | INTRAVENOUS | Status: DC | PRN

## 2019-02-07 MED ORDER — DEXAMETHASONE SODIUM PHOSPHATE 10 MG/ML IJ SOLN
INTRAMUSCULAR | Status: DC | PRN
  Administered 2019-02-07: 10 mg via INTRAVENOUS

## 2019-02-07 MED ORDER — PHENYLEPHRINE HCL (PRESSORS) 10 MG/ML IV SOLN
INTRAVENOUS | Status: AC
  Filled 2019-02-07: qty 2

## 2019-02-07 MED ORDER — ONDANSETRON HCL 4 MG/2ML IJ SOLN
INTRAMUSCULAR | Status: DC | PRN
  Administered 2019-02-07: 4 mg via INTRAVENOUS

## 2019-02-07 MED ORDER — SODIUM CHLORIDE 0.9 % IV SOLN: 500.0000 mL | Freq: Once | INTRAVENOUS | Status: DC | PRN

## 2019-02-07 MED ORDER — SUGAMMADEX SODIUM 200 MG/2ML IV SOLN
INTRAVENOUS | Status: DC | PRN
  Administered 2019-02-07: 100 mg via INTRAVENOUS

## 2019-02-07 MED ORDER — HEPARIN (PORCINE) 25000 UT/250ML-% IV SOLN
400.0000 [IU]/h | INTRAVENOUS | Status: AC
  Administered 2019-02-07: 05:00:00 400 [IU]/h via INTRAVENOUS

## 2019-02-07 MED ORDER — FENTANYL CITRATE (PF) 100 MCG/2ML IJ SOLN
25.0000 ug | INTRAMUSCULAR | Status: DC | PRN
  Administered 2019-02-07 (×3): 50 ug via INTRAVENOUS

## 2019-02-07 MED ORDER — SUFENTANIL CITRATE 50 MCG/ML IV SOLN
INTRAVENOUS | Status: AC
  Filled 2019-02-07: qty 1

## 2019-02-07 MED ORDER — SUFENTANIL CITRATE 50 MCG/ML IV SOLN
INTRAVENOUS | Status: DC | PRN
  Administered 2019-02-07 (×2): 10 ug via INTRAVENOUS

## 2019-02-07 MED ORDER — SODIUM CHLORIDE 0.9 % IV SOLN
INTRAVENOUS | Status: DC
  Administered 2019-02-07 – 2019-02-09 (×4): via INTRAVENOUS

## 2019-02-07 MED ORDER — SODIUM CHLORIDE (PF) 0.9 % IJ SOLN
INTRAMUSCULAR | Status: AC
  Filled 2019-02-07: qty 10

## 2019-02-07 MED ORDER — PHENYLEPHRINE HCL (PRESSORS) 10 MG/ML IV SOLN
INTRAVENOUS | Status: DC | PRN
  Administered 2019-02-07 (×2): 80 ug via INTRAVENOUS

## 2019-02-07 MED ORDER — PHENOL 1.4 % MT LIQD: 1.0000 | OROMUCOSAL | Status: DC | PRN

## 2019-02-07 MED ORDER — CEFAZOLIN SODIUM-DEXTROSE 2-4 GM/100ML-% IV SOLN
2.0000 g | Freq: Three times a day (TID) | INTRAVENOUS | Status: AC
  Administered 2019-02-07 (×2): 2 g via INTRAVENOUS
  Filled 2019-02-07 (×3): qty 100

## 2019-02-07 MED ORDER — ROCURONIUM 10MG/ML (10ML) SYRINGE FOR MEDFUSION PUMP - OPTIME
INTRAVENOUS | Status: DC | PRN
  Administered 2019-02-07: 50 mg via INTRAVENOUS

## 2019-02-07 MED ORDER — HEPARIN (PORCINE) 25000 UT/250ML-% IV SOLN
INTRAVENOUS | Status: AC
  Administered 2019-02-07: 400 [IU]/h via INTRAVENOUS
  Filled 2019-02-07: qty 250

## 2019-02-07 MED ORDER — MIDAZOLAM HCL 2 MG/2ML IJ SOLN
INTRAMUSCULAR | Status: DC | PRN
  Administered 2019-02-07: 2 mg via INTRAVENOUS

## 2019-02-07 MED ORDER — SUCCINYLCHOLINE CHLORIDE 200 MG/10ML IV SOSY
PREFILLED_SYRINGE | INTRAVENOUS | Status: DC | PRN
  Administered 2019-02-07: 100 mg via INTRAVENOUS

## 2019-02-07 MED ORDER — 0.9 % SODIUM CHLORIDE (POUR BTL) OPTIME
TOPICAL | Status: DC | PRN
  Administered 2019-02-07: 02:00:00 2000 mL

## 2019-02-07 MED ORDER — DEXAMETHASONE SODIUM PHOSPHATE 10 MG/ML IJ SOLN
INTRAMUSCULAR | Status: AC
  Filled 2019-02-07: qty 1

## 2019-02-07 MED ORDER — SODIUM CHLORIDE 0.9 % IV SOLN
INTRAVENOUS | Status: DC | PRN
  Administered 2019-02-07: 50 ug/min via INTRAVENOUS

## 2019-02-07 MED ORDER — PROPOFOL 10 MG/ML IV BOLUS
INTRAVENOUS | Status: DC | PRN
  Administered 2019-02-07: 200 mg via INTRAVENOUS

## 2019-02-07 MED ORDER — SODIUM CHLORIDE 0.9 % IV SOLN
INTRAVENOUS | Status: AC
  Filled 2019-02-07: qty 1.2

## 2019-02-07 MED ORDER — LACTATED RINGERS IV SOLN
INTRAVENOUS | Status: DC | PRN
  Administered 2019-02-07 (×2): via INTRAVENOUS

## 2019-02-07 MED ORDER — PROPOFOL 10 MG/ML IV BOLUS
INTRAVENOUS | Status: AC
  Filled 2019-02-07: qty 20

## 2019-02-07 MED ORDER — PANTOPRAZOLE SODIUM 40 MG PO TBEC
40.0000 mg | DELAYED_RELEASE_TABLET | Freq: Every day | ORAL | Status: DC
  Administered 2019-02-07 – 2019-02-11 (×5): 40 mg via ORAL
  Filled 2019-02-07 (×5): qty 1

## 2019-02-07 MED ORDER — OXYCODONE-ACETAMINOPHEN 5-325 MG PO TABS
1.0000 | ORAL_TABLET | ORAL | Status: DC | PRN
  Administered 2019-02-07 – 2019-02-11 (×16): 2 via ORAL
  Filled 2019-02-07 (×16): qty 2

## 2019-02-07 MED ORDER — MIDAZOLAM HCL 2 MG/2ML IJ SOLN
INTRAMUSCULAR | Status: AC
  Filled 2019-02-07: qty 2

## 2019-02-07 MED ORDER — HEPARIN SODIUM (PORCINE) 1000 UNIT/ML IJ SOLN
INTRAMUSCULAR | Status: DC | PRN
  Administered 2019-02-07: 9000 [IU] via INTRAVENOUS

## 2019-02-07 MED ORDER — ONDANSETRON HCL 4 MG/2ML IJ SOLN
4.0000 mg | Freq: Four times a day (QID) | INTRAMUSCULAR | Status: DC | PRN
  Administered 2019-02-10: 12:00:00 4 mg via INTRAVENOUS

## 2019-02-07 MED ORDER — HEPARIN (PORCINE) 25000 UT/250ML-% IV SOLN
950.0000 [IU]/h | INTRAVENOUS | Status: DC
  Administered 2019-02-08: 1200 [IU]/h via INTRAVENOUS
  Filled 2019-02-07: qty 250

## 2019-02-07 MED ORDER — PAPAVERINE HCL 30 MG/ML IJ SOLN
INTRAMUSCULAR | Status: DC | PRN
  Administered 2019-02-07: 60 mg

## 2019-02-07 MED ORDER — PAPAVERINE HCL 30 MG/ML IJ SOLN
INTRAMUSCULAR | Status: AC
  Filled 2019-02-07: qty 2

## 2019-02-07 MED ORDER — GLYCOPYRROLATE 0.2 MG/ML IJ SOLN
INTRAMUSCULAR | Status: DC | PRN
  Administered 2019-02-07: 0.2 mg via INTRAVENOUS

## 2019-02-07 MED ORDER — GLYCOPYRROLATE PF 0.2 MG/ML IJ SOSY
PREFILLED_SYRINGE | INTRAMUSCULAR | Status: AC
  Filled 2019-02-07: qty 1

## 2019-02-07 MED ORDER — PROMETHAZINE HCL 25 MG/ML IJ SOLN: 6.2500 mg | INTRAMUSCULAR | Status: DC | PRN

## 2019-02-07 MED ORDER — LIDOCAINE 2% (20 MG/ML) 5 ML SYRINGE
INTRAMUSCULAR | Status: AC
  Filled 2019-02-07: qty 5

## 2019-02-07 MED ORDER — ONDANSETRON HCL 4 MG/2ML IJ SOLN
INTRAMUSCULAR | Status: AC
  Filled 2019-02-07: qty 2

## 2019-02-07 MED ORDER — HEPARIN SODIUM (PORCINE) 1000 UNIT/ML IJ SOLN
INTRAMUSCULAR | Status: AC
  Filled 2019-02-07: qty 1

## 2019-02-07 MED ORDER — HYDRALAZINE HCL 20 MG/ML IJ SOLN: 5.0000 mg | INTRAMUSCULAR | Status: DC | PRN

## 2019-02-07 MED ORDER — FENTANYL CITRATE (PF) 100 MCG/2ML IJ SOLN
INTRAMUSCULAR | Status: AC
  Filled 2019-02-07: qty 2

## 2019-02-07 MED ORDER — FENTANYL CITRATE (PF) 100 MCG/2ML IJ SOLN
INTRAMUSCULAR | Status: AC
  Administered 2019-02-07: 50 ug via INTRAVENOUS
  Filled 2019-02-07: qty 2

## 2019-02-07 MED ORDER — ALUM & MAG HYDROXIDE-SIMETH 200-200-20 MG/5ML PO SUSP: 15.0000 mL | ORAL | Status: DC | PRN

## 2019-02-07 MED ORDER — ACETAMINOPHEN 325 MG RE SUPP: 325.0000 mg | RECTAL | Status: DC | PRN

## 2019-02-07 MED ORDER — PHENYLEPHRINE 40 MCG/ML (10ML) SYRINGE FOR IV PUSH (FOR BLOOD PRESSURE SUPPORT)
PREFILLED_SYRINGE | INTRAVENOUS | Status: AC
  Filled 2019-02-07: qty 10

## 2019-02-07 MED ORDER — ACETAMINOPHEN 325 MG PO TABS
325.0000 mg | ORAL_TABLET | ORAL | Status: DC | PRN
  Administered 2019-02-09: 650 mg via ORAL
  Filled 2019-02-07: qty 2

## 2019-02-07 MED ORDER — LIDOCAINE 2% (20 MG/ML) 5 ML SYRINGE
INTRAMUSCULAR | Status: DC | PRN
  Administered 2019-02-07: 100 mg via INTRAVENOUS

## 2019-02-07 MED ORDER — SODIUM CHLORIDE 0.9 % IV SOLN
INTRAVENOUS | Status: DC | PRN
  Administered 2019-02-07: 500 mL

## 2019-02-07 MED ORDER — BISACODYL 10 MG RE SUPP: 10.0000 mg | Freq: Every day | RECTAL | Status: DC | PRN

## 2019-02-07 MED ORDER — DOCUSATE SODIUM 100 MG PO CAPS
100.0000 mg | ORAL_CAPSULE | Freq: Every day | ORAL | Status: DC
  Administered 2019-02-08 – 2019-02-11 (×3): 100 mg via ORAL
  Filled 2019-02-07 (×4): qty 1

## 2019-02-07 MED ORDER — POLYETHYLENE GLYCOL 3350 17 G PO PACK: 17.0000 g | PACK | Freq: Every day | ORAL | Status: DC | PRN

## 2019-02-07 MED ORDER — CEFAZOLIN SODIUM 1 G IJ SOLR
INTRAMUSCULAR | Status: AC
  Filled 2019-02-07: qty 20

## 2019-02-07 MED ORDER — MORPHINE SULFATE (PF) 2 MG/ML IV SOLN
2.0000 mg | INTRAVENOUS | Status: DC | PRN
  Administered 2019-02-07 – 2019-02-08 (×5): 2 mg via INTRAVENOUS
  Filled 2019-02-07 (×5): qty 1

## 2019-02-07 MED ORDER — SUCCINYLCHOLINE CHLORIDE 200 MG/10ML IV SOSY
PREFILLED_SYRINGE | INTRAVENOUS | Status: AC
  Filled 2019-02-07: qty 10

## 2019-02-07 MED ORDER — METOPROLOL TARTRATE 5 MG/5ML IV SOLN: 2.0000 mg | INTRAVENOUS | Status: DC | PRN

## 2019-02-07 MED ORDER — ASPIRIN EC 81 MG PO TBEC
81.0000 mg | DELAYED_RELEASE_TABLET | Freq: Every day | ORAL | Status: DC
  Administered 2019-02-07 – 2019-02-11 (×5): 81 mg via ORAL
  Filled 2019-02-07 (×5): qty 1

## 2019-02-07 MED ORDER — POTASSIUM CHLORIDE CRYS ER 20 MEQ PO TBCR: 20.0000 meq | EXTENDED_RELEASE_TABLET | Freq: Every day | ORAL | Status: DC | PRN

## 2019-02-07 SURGICAL SUPPLY — 65 items
BANDAGE ESMARK 6X9 LF (GAUZE/BANDAGES/DRESSINGS) IMPLANT
BNDG ELASTIC 4X5.8 VLCR STR LF (GAUZE/BANDAGES/DRESSINGS) IMPLANT
BNDG ESMARK 6X9 LF (GAUZE/BANDAGES/DRESSINGS)
CANISTER SUCT 3000ML PPV (MISCELLANEOUS) ×5 IMPLANT
CANISTER WOUNDNEG PRESSURE 500 (CANNISTER) ×5 IMPLANT
CANNULA VESSEL 3MM 2 BLNT TIP (CANNULA) ×15 IMPLANT
CLIP VESOCCLUDE MED 24/CT (CLIP) ×5 IMPLANT
CLIP VESOCCLUDE SM WIDE 24/CT (CLIP) ×5 IMPLANT
CLIP VESOCCLUDE SM WIDE 6/CT (CLIP) ×5 IMPLANT
COVER WAND RF STERILE (DRAPES) ×5 IMPLANT
CUFF TOURN SGL QUICK 24 (TOURNIQUET CUFF)
CUFF TOURN SGL QUICK 34 (TOURNIQUET CUFF)
CUFF TOURN SGL QUICK 42 (TOURNIQUET CUFF) IMPLANT
CUFF TRNQT CYL 24X4X16.5-23 (TOURNIQUET CUFF) IMPLANT
CUFF TRNQT CYL 34X4.125X (TOURNIQUET CUFF) IMPLANT
DERMABOND ADVANCED (GAUZE/BANDAGES/DRESSINGS) ×4
DERMABOND ADVANCED .7 DNX12 (GAUZE/BANDAGES/DRESSINGS) ×6 IMPLANT
DRAIN CHANNEL 15F RND FF W/TCR (WOUND CARE) ×5 IMPLANT
DRAIN PENROSE 3/4X12 (DRAIN) IMPLANT
DRAPE X-RAY CASS 24X20 (DRAPES) IMPLANT
DRSG COVADERM 4X8 (GAUZE/BANDAGES/DRESSINGS) ×5 IMPLANT
DRSG VAC ATS MED SENSATRAC (GAUZE/BANDAGES/DRESSINGS) ×5 IMPLANT
ELECT REM PT RETURN 9FT ADLT (ELECTROSURGICAL) ×5
ELECTRODE REM PT RTRN 9FT ADLT (ELECTROSURGICAL) ×3 IMPLANT
EVACUATOR SILICONE 100CC (DRAIN) ×5 IMPLANT
GAUZE 4X4 16PLY RFD (DISPOSABLE) ×5 IMPLANT
GAUZE SPONGE 2X2 8PLY NS (GAUZE/BANDAGES/DRESSINGS) ×10 IMPLANT
GAUZE SPONGE 4X4 12PLY STRL LF (GAUZE/BANDAGES/DRESSINGS) ×5 IMPLANT
GAUZE VASELINE FOILPK 1/2 X 72 (GAUZE/BANDAGES/DRESSINGS) ×10 IMPLANT
GLOVE BIO SURGEON STRL SZ7.5 (GLOVE) ×15 IMPLANT
GLOVE BIOGEL PI IND STRL 8 (GLOVE) ×3 IMPLANT
GLOVE BIOGEL PI INDICATOR 8 (GLOVE) ×2
GOWN STRL REUS W/ TWL LRG LVL3 (GOWN DISPOSABLE) ×9 IMPLANT
GOWN STRL REUS W/TWL LRG LVL3 (GOWN DISPOSABLE) ×6
KIT BASIN OR (CUSTOM PROCEDURE TRAY) ×5 IMPLANT
KIT TURNOVER KIT B (KITS) ×5 IMPLANT
MARKER GRAFT CORONARY BYPASS (MISCELLANEOUS) IMPLANT
NS IRRIG 1000ML POUR BTL (IV SOLUTION) ×10 IMPLANT
PACK PERIPHERAL VASCULAR (CUSTOM PROCEDURE TRAY) ×5 IMPLANT
PAD ARMBOARD 7.5X6 YLW CONV (MISCELLANEOUS) ×10 IMPLANT
SET COLLECT BLD 21X3/4 12 (NEEDLE) IMPLANT
SPONGE SURGIFOAM ABS GEL 100 (HEMOSTASIS) IMPLANT
STAPLER VISISTAT (STAPLE) IMPLANT
STAPLER VISISTAT 35W (STAPLE) ×5 IMPLANT
STOPCOCK 4 WAY LG BORE MALE ST (IV SETS) IMPLANT
SUT ETHILON 3 0 PS 1 (SUTURE) ×5 IMPLANT
SUT PROLENE 5 0 C 1 24 (SUTURE) ×15 IMPLANT
SUT PROLENE 6 0 BV (SUTURE) ×15 IMPLANT
SUT PROLENE 7 0 BV 1 (SUTURE) IMPLANT
SUT SILK 2 0 PERMA HAND 18 BK (SUTURE) IMPLANT
SUT SILK 3 0 (SUTURE)
SUT SILK 3-0 18XBRD TIE 12 (SUTURE) IMPLANT
SUT VIC AB 2-0 CT1 27 (SUTURE) ×2
SUT VIC AB 2-0 CT1 TAPERPNT 27 (SUTURE) ×3 IMPLANT
SUT VIC AB 2-0 CTB1 (SUTURE) IMPLANT
SUT VIC AB 3-0 SH 27 (SUTURE) ×4
SUT VIC AB 3-0 SH 27X BRD (SUTURE) ×6 IMPLANT
SUT VIC AB 4-0 PS2 18 (SUTURE) ×10 IMPLANT
SUT VICRYL 4-0 PS2 18IN ABS (SUTURE) IMPLANT
TAPE CLOTH SURG 6X10 WHT LF (GAUZE/BANDAGES/DRESSINGS) ×10 IMPLANT
TOWEL GREEN STERILE (TOWEL DISPOSABLE) ×5 IMPLANT
TRAY FOLEY MTR SLVR 16FR STAT (SET/KITS/TRAYS/PACK) ×5 IMPLANT
TUBING EXTENTION W/L.L. (IV SETS) IMPLANT
UNDERPAD 30X30 (UNDERPADS AND DIAPERS) ×5 IMPLANT
WATER STERILE IRR 1000ML POUR (IV SOLUTION) ×5 IMPLANT
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
## 2019-02-07 NOTE — Anesthesia Procedure Notes (Addendum)
Procedure Name: Intubation Date/Time: 02/07/2019 1:35 AM Performed by: Claris Che, CRNA Pre-anesthesia Checklist: Patient identified, Emergency Drugs available, Suction available, Patient being monitored and Timeout performed Patient Re-evaluated:Patient Re-evaluated prior to induction Oxygen Delivery Method: Circle system utilized Preoxygenation: Pre-oxygenation with 100% oxygen Induction Type: IV induction, Rapid sequence and Cricoid Pressure applied Laryngoscope Size: Mac and 3 Grade View: Grade II Tube type: Oral Tube size: 8.0 mm Number of attempts: 1 Airway Equipment and Method: Stylet Placement Confirmation: ETT inserted through vocal cords under direct vision,  positive ETCO2 and breath sounds checked- equal and bilateral Secured at: 24 cm Tube secured with: Tape Dental Injury: Teeth and Oropharynx as per pre-operative assessment

## 2019-02-07 NOTE — Transfer of Care (Signed)
Immediate Anesthesia Transfer of Care Note  Patient: Tyler Rowland  Procedure(s) Performed: LEFT LEG EXPLORATION WITH REPAIR OF LEFT FEMORAL VEIN, REPAIR OF LEFT FEMORAL ARTERY WITH HARVESTED GREATER SAPHENOUS VEIN (Left ) VEIN HARVEST OF RIGHT GREATER SAPHENOUS VEIN (Right ) FOUR COMPARTMENT FASCIOTOMY OF LEFT LOWER LEG (Left ) Application Of Wound Vac TO LEFT LOWE LEG  Patient Location: PACU  Anesthesia Type:General  Level of Consciousness: sedated, drowsy, patient cooperative and responds to stimulation  Airway & Oxygen Therapy: Patient Spontanous Breathing and Patient connected to nasal cannula oxygen  Post-op Assessment: Report given to RN, Post -op Vital signs reviewed and stable and Patient moving all extremities X 4  Post vital signs: Reviewed and stable  Last Vitals:  Vitals Value Taken Time  BP 143/95 02/07/19 0428  Temp    Pulse 82 02/07/19 0429  Resp 18 02/07/19 0429  SpO2 100 % 02/07/19 0429  Vitals shown include unvalidated device data.  Last Pain:  Vitals:   02/06/19 2328  TempSrc:   PainSc: 5          Complications: No apparent anesthesia complications
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
## 2019-02-07 NOTE — Plan of Care (Signed)
Poc progressing.  
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
## 2019-02-07 NOTE — Progress Notes (Addendum)
  Day of Surgery Note    Subjective:  No complaints   Vitals:   02/07/19 0535 02/07/19 0550  BP:  119/80  Pulse: 76   Resp: 16   Temp: 98.7 F (37.1 C) 98.7 F (37.1 C)  SpO2: 100% 98%    Incisions:   Bandages in place and dry; wound vac with good seal Extremities:  Easily palpable left DP pulse Cardiac:  regular Lungs:  Non labored    Assessment/Plan:  This is a 43 y.o. male who is s/p  1.  Repair of left femoral vein (primarily closed transversely). 2.  Repair of intimal injury to left superficial femoral artery 3.  Harvesting of right great saphenous vein 4.  Vein patch angioplasty of left superficial femoral artery 5.  4 compartment fasciotomy 6.  Placement of VAC on medial incision  -pt doing well with easily palpable left DP pulse -heparin at 400U/hr and then switch to full dose at 10am per pharmacy without bolus.  -continue wound vac to medial fasciotomy site -creatinine at 1.6.  Will check labs tomorrow.  IVF for hydration.   Leontine Locket, PA-C 02/07/2019 6:43 AM [phone]
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
## 2019-02-07 NOTE — Anesthesia Postprocedure Evaluation (Signed)
Anesthesia Post Note  Patient: Engineer, petroleum  Procedure(s) Performed: LEFT LEG EXPLORATION WITH REPAIR OF LEFT FEMORAL VEIN, REPAIR OF LEFT FEMORAL ARTERY WITH HARVESTED GREATER SAPHENOUS VEIN (Left ) VEIN HARVEST OF RIGHT GREATER SAPHENOUS VEIN (Right ) FOUR COMPARTMENT FASCIOTOMY OF LEFT LOWER LEG (Left ) Application Of Wound Vac TO LEFT LOWE LEG     Patient location during evaluation: PACU Anesthesia Type: General Level of consciousness: awake and alert Pain management: pain level controlled Vital Signs Assessment: post-procedure vital signs reviewed and stable Respiratory status: spontaneous breathing, nonlabored ventilation and respiratory function stable Cardiovascular status: blood pressure returned to baseline and stable Postop Assessment: no apparent nausea or vomiting Anesthetic complications: no    Last Vitals:  Vitals:   02/07/19 0535 02/07/19 0550  BP:  119/80  Pulse: 76   Resp: 16   Temp: 37.1 C 37.1 C  SpO2: 100% 98%    Last Pain:  Vitals:   02/07/19 0550  TempSrc: Oral  PainSc:                  Catalina Gravel
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
## 2019-02-08 ENCOUNTER — Encounter (HOSPITAL_COMMUNITY): Payer: Self-pay | Admitting: Vascular Surgery

## 2019-02-08 LAB — BASIC METABOLIC PANEL
Anion gap: 7 (ref 5–15)
BUN: 7 mg/dL (ref 6–20)
CO2: 26 mmol/L (ref 22–32)
Calcium: 7.7 mg/dL — ABNORMAL LOW (ref 8.9–10.3)
Chloride: 107 mmol/L (ref 98–111)
Creatinine, Ser: 1.18 mg/dL (ref 0.61–1.24)
GFR calc Af Amer: >60 mL/min (ref >60)
GFR calc non Af Amer: >60 mL/min (ref >60)
Glucose, Bld: 142 mg/dL — ABNORMAL HIGH (ref 70–99)
Potassium: 3.8 mmol/L (ref 3.5–5.1)
Sodium: 140 mmol/L (ref 135–145)

## 2019-02-08 LAB — CBC
HCT: 27.1 % — ABNORMAL LOW (ref 39.0–52.0)
HCT: 24.2 % — ABNORMAL LOW (ref 39.0–52.0)
Hemoglobin: 8.9 g/dL — ABNORMAL LOW (ref 13.0–17.0)
Hemoglobin: 8 g/dL — ABNORMAL LOW (ref 13.0–17.0)
MCH: 29.9 pg (ref 26.0–34.0)
MCH: 30.3 pg (ref 26.0–34.0)
MCHC: 32.8 g/dL (ref 30.0–36.0)
MCHC: 33.1 g/dL (ref 30.0–36.0)
MCV: 90.9 fL (ref 80.0–100.0)
MCV: 91.7 fL (ref 80.0–100.0)
Platelets: 174 10*3/uL (ref 150–400)
Platelets: 188 10*3/uL (ref 150–400)
RBC: 2.98 MIL/uL — ABNORMAL LOW (ref 4.22–5.81)
RBC: 2.64 MIL/uL — ABNORMAL LOW (ref 4.22–5.81)
RDW: 12.8 % (ref 11.5–15.5)
RDW: 13 % (ref 11.5–15.5)
WBC: 8.7 10*3/uL (ref 4.0–10.5)
WBC: 9.1 10*3/uL (ref 4.0–10.5)
nRBC: 0 % (ref 0.0–0.2)
nRBC: 0 % (ref 0.0–0.2)

## 2019-02-08 LAB — HEPARIN LEVEL (UNFRACTIONATED)
Heparin Unfractionated: 1.01 IU/mL — ABNORMAL HIGH (ref 0.30–0.70)
Heparin Unfractionated: 0.94 IU/mL — ABNORMAL HIGH (ref 0.30–0.70)
Heparin Unfractionated: 0.89 IU/mL — ABNORMAL HIGH (ref 0.30–0.70)

## 2019-02-08 LAB — APTT: aPTT: 151 seconds — ABNORMAL HIGH (ref 24–36)

## 2019-02-08 MED ORDER — HEPARIN (PORCINE) 25000 UT/250ML-% IV SOLN
750.0000 [IU]/h | INTRAVENOUS | Status: DC
  Administered 2019-02-08: 750 [IU]/h via INTRAVENOUS

## 2019-02-08 MED ORDER — MORPHINE SULFATE (PF) 4 MG/ML IV SOLN
5.0000 mg | Freq: Once | INTRAVENOUS | Status: AC
  Administered 2019-02-08: 3 mg via INTRAVENOUS
  Filled 2019-02-08: qty 2

## 2019-02-08 MED ORDER — HEPARIN (PORCINE) 25000 UT/250ML-% IV SOLN: 750.0000 [IU]/h | INTRAVENOUS | Status: DC

## 2019-02-08 MED ORDER — HEPARIN (PORCINE) 25000 UT/250ML-% IV SOLN: 550.0000 [IU]/h | INTRAVENOUS | Status: DC
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
## 2019-02-08 NOTE — Progress Notes (Signed)
Scot Dock MD informed that patient's wound vac leaking.  Verbal order given to remove wound vac and replace with hydrogel/wet to dry gauze.
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
## 2019-02-08 NOTE — Progress Notes (Signed)
Called and notified pharmacist that pt's hemoglobin had dropped. Heparin is running at 9.5 ml/hr as ordered currently. Notified pt had bloody 50 ml drainage from wound vac and 10 ml drainage from jp drain, but non of the wound has visible drainage. Pt's vitals stable. Call bell within reach. Will continue to monitor.

## 2019-02-08 NOTE — Progress Notes (Addendum)
Progress Note  VASCULAR SURGERY ASSESSMENT & PLAN:   POD 1 S/P REPAIR OF RIGHT FV & SFA/ FASCIOTOMIES: Palpable pedal pulses in the left foot.  Moderate left lower extremity swelling.  Neuro is intact.  JP with moderate serous drainage.  Leave for another day.  ANTICOAGULATION: He is on intravenous heparin.  We will continue this up until his fasciotomy is closed on Tuesday.  We will start Xarelto on Tuesday with plans for discharging him on Xarelto for 3 to 6 months on Wednesday.  Waverly Ferrarihristopher Sirinity Outland, MD, FACS Beeper [phone] Office: [phone]   02/08/2019 7:51 AM 1 Day Post-Op  Subjective:  Says he feels like there is less swelling in his leg today.  Asks about incision in right groin  Tm 99.2 now afebrile HR 60's-90's NSR 100's-120's systolic 100% RA  Vitals:   02/08/19 0015 02/08/19 0333  BP: (!) 109/53 116/64  Pulse: 84 78  Resp: 16 17  Temp: 98.1 F (36.7 C) 98.1 F (36.7 C)  SpO2: 99% 100%    Physical Exam: Cardiac:  regular Lungs:  Non labored Incisions:  Right groin incision is clean and dry; left thigh incision is clean and dry; left lateral fasciotomy incision is clean with staples in tact; left medial fasciotomy with wound vac in place with good seal.   Extremities:  Easily palpable left DP/PT pulse; left foot is warm; motor and sensory are in tact.    CBC    Component Value Date/Time   WBC 8.7 02/08/2019 0308   RBC 2.98 (L) 02/08/2019 0308   HGB 8.9 (L) 02/08/2019 0308   HCT 27.1 (L) 02/08/2019 0308   PLT 174 02/08/2019 0308   MCV 90.9 02/08/2019 0308   MCH 29.9 02/08/2019 0308   MCHC 32.8 02/08/2019 0308   RDW 12.8 02/08/2019 0308    BMET    Component Value Date/Time   NA 140 02/08/2019 0308   K 3.8 02/08/2019 0308   CL 107 02/08/2019 0308   CO2 26 02/08/2019 0308   GLUCOSE 142 (H) 02/08/2019 0308   BUN 7 02/08/2019 0308   CREATININE 1.18 02/08/2019 0308   CALCIUM 7.7 (L) 02/08/2019 0308   GFRNONAA >60 02/08/2019 0308   GFRAA >60  02/08/2019 0308    INR    Component Value Date/Time   INR 1.1 02/06/2019 2033     Intake/Output Summary (Last 24 hours) at 02/08/2019 0751 Last data filed at 02/08/2019 0630 Gross per 24 hour  Intake 1774.73 ml  Output 2495 ml  Net -720.27 ml   JP drain with 70cc/h24hr (10cc last shift) Wound vac with 225cc/24hr (75cc last shift)  Assessment:  43 y.o. male is s/p:  1.  Repair of left femoral vein (primarily closed transversely). 2.  Repair of intimal injury to left superficial femoral artery 3.  Harvesting of right great saphenous vein 4.  Vein patch angioplasty of left superficial femoral artery 5.  4 compartment fasciotomy 6.  Placement of VAC on medial incision  1 Day Post-Op  Plan: -pt with palpable left DP/PT pulse and motor/sensory are in tact.  -right groin and left thigh incisions look good as well as left lateral fasciotomy incision.   -left medial fasciotomy wound with wound vac in place with good seal.  Plan to take back to OR on Tuesday for fasciotomy closure.   Continue to elevate left leg to help with swelling-discussed with pt.   -creatinine much improved this morning from 1.63 to 1.18.  Plan to start Xarelto or Eliquis  on Tuesday given he had venous as well as arterial injury.  Will consult case management for pricing.   -JP drain with 70cc out in 24 hrs-will keep JP drain one more day.  -needs to be up out of bed.  Leg elevation when he is not ambulating. -awaiting PT/OT eval    Leontine Locket, PA-C [facility] [phone] 02/08/2019 7:51 AM

## 2019-02-08 NOTE — Progress Notes (Signed)
VASCULAR SURGERY:  Patient was having some persistent oozing from his fasciotomy site.  I removed his back. There were a couple of skin edge bleeders which I cauterized.  I will stop his heparin for 4 hours.  Follow-up CBC in a.m.  Deitra Mayo, MD, FACS

## 2019-02-09 ENCOUNTER — Other Ambulatory Visit: Payer: Self-pay

## 2019-02-09 ENCOUNTER — Encounter (HOSPITAL_COMMUNITY): Payer: Self-pay | Admitting: General Practice

## 2019-02-09 LAB — CBC
HCT: 19.1 % — ABNORMAL LOW (ref 39.0–52.0)
Hemoglobin: 6.3 g/dL — CL (ref 13.0–17.0)
MCH: 30.3 pg (ref 26.0–34.0)
MCHC: 33 g/dL (ref 30.0–36.0)
MCV: 91.8 fL (ref 80.0–100.0)
Platelets: 157 10*3/uL (ref 150–400)
RBC: 2.08 MIL/uL — ABNORMAL LOW (ref 4.22–5.81)
RDW: 13 % (ref 11.5–15.5)
WBC: 7.5 10*3/uL (ref 4.0–10.5)
nRBC: 0 % (ref 0.0–0.2)

## 2019-02-09 LAB — PREPARE RBC (CROSSMATCH)

## 2019-02-09 LAB — HEMOGLOBIN AND HEMATOCRIT, BLOOD
HCT: 22.8 % — ABNORMAL LOW (ref 39.0–52.0)
Hemoglobin: 7.5 g/dL — ABNORMAL LOW (ref 13.0–17.0)

## 2019-02-09 LAB — HEPARIN LEVEL (UNFRACTIONATED): Heparin Unfractionated: <0.1 IU/mL — ABNORMAL LOW (ref 0.30–0.70)

## 2019-02-09 MED ORDER — HEPARIN (PORCINE) 25000 UT/250ML-% IV SOLN
500.0000 [IU]/h | INTRAVENOUS | Status: DC
  Administered 2019-02-09: 12:00:00 500 [IU]/h via INTRAVENOUS
  Filled 2019-02-09: qty 250

## 2019-02-09 MED ORDER — SODIUM CHLORIDE 0.9% IV SOLUTION: Freq: Once | INTRAVENOUS | Status: DC

## 2019-02-09 MED ORDER — CEFAZOLIN SODIUM-DEXTROSE 1-4 GM/50ML-% IV SOLN
1.0000 g | INTRAVENOUS | Status: AC
  Filled 2019-02-09 (×2): qty 50

## 2019-02-09 NOTE — Progress Notes (Signed)
Bedside report received per day shift RN. Was waiting on some hydrocele ointment to change wound vac dressing as it was leaking. When entered the room, pt had blood soaked underpad, wound vac was leaking  and bloody drainage in the canister. jp drain was dranning bloody drainage too. Dr. Scot Dock was paged per day shift RN and had received order to d/c wound vac and apply wet to dry. I paged DR. Scot Dock again and notified bleeding seems a lot and pt is not ready for dressing change yet. I asked MD if I can order cbc, he said ok. He said he will come and see the patient. Will continue to monitor.
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
## 2019-02-09 NOTE — Plan of Care (Signed)
Poc progressing.  
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
## 2019-02-09 NOTE — Progress Notes (Signed)
CRITICAL VALUE ALERT  Critical Value: hemoglobin 6.3  Date & Time Notied: 02/09/19, 0441  Provider Notified: DR.  Deitra Mayo   Orders Received/Actions taken: received order for 2 units of blood transfusion.
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
## 2019-02-09 NOTE — Progress Notes (Signed)
Talked with pharmacist Jeneen Rinks and updated on pt's Hgb being 6.3 and getting 2 units of blood. Notified he had moderate amount of bloody  drainage from jp drain throughout the night, and DR. Scot Dock stop by last night for bleeding. HE said as long as heparin is running on low rate its fine. Will evaluate after blood transfusion. I restarted heparin at  0116 @5 .51ml/hr.Will continue to monitor.

## 2019-02-09 NOTE — Progress Notes (Signed)
Called and notified DR. Scot Dock that pt had 325 ml bloody drainage from JP drain for the shift. Received order to discontinue heparin. I stopped heparin at 0630, pharmacy notified. Later DR. Scot Dock came by bedside to assess pt. Will continue to monitor.

## 2019-02-09 NOTE — Progress Notes (Signed)
JP drain to left thigh was dranning bloody drainage, and was filling up too fast. I had emptied 180 ml in last two hours. And heparin has been on hold. I applied gentle pressure above JP drain site for 10-15 min. Pt tolerated well.  Left leg elevated. JP drain not feeling up so fast currently. Call bell within reach. Will continue to monitor.

## 2019-02-09 NOTE — Progress Notes (Signed)
Dr. Scot Dock by bedside. Pt wanted some pain meds before wound vac removal. Pt received 5 mg iv morphine. Wound vac dressing removed per dr. Scot Dock on left leg fasciotomy site. MD  cauterized bleeding area . Covered with 4x4, Kurlex and ace wrap. Pt's left leg elevated with head flat per MD order. Heparin held per MD order, will continue to monitor.

## 2019-02-10 ENCOUNTER — Inpatient Hospital Stay (HOSPITAL_COMMUNITY): Payer: Managed Care, Other (non HMO) | Admitting: Anesthesiology

## 2019-02-10 ENCOUNTER — Encounter (HOSPITAL_COMMUNITY): Payer: Self-pay

## 2019-02-10 ENCOUNTER — Encounter (HOSPITAL_COMMUNITY)
Admission: EM | Disposition: A | Discharge status: Home / Self Care | Payer: Self-pay | Source: Home / Self Care | Attending: Vascular Surgery

## 2019-02-10 DIAGNOSIS — S75192A Other specified injury of femoral vein at hip and thigh level, left leg, initial encounter: Secondary | ICD-10-CM

## 2019-02-10 DIAGNOSIS — S75091A Other specified injury of femoral artery, right leg, initial encounter: Secondary | ICD-10-CM

## 2019-02-10 HISTORY — PX: FASCIOTOMY CLOSURE: SHX5829

## 2019-02-10 LAB — CBC
HCT: 20.9 % — ABNORMAL LOW (ref 39.0–52.0)
Hemoglobin: 7 g/dL — ABNORMAL LOW (ref 13.0–17.0)
MCH: 27.8 pg (ref 26.0–34.0)
MCHC: 33.5 g/dL (ref 30.0–36.0)
MCV: 82.9 fL (ref 80.0–100.0)
Platelets: 144 10*3/uL — ABNORMAL LOW (ref 150–400)
RBC: 2.52 MIL/uL — ABNORMAL LOW (ref 4.22–5.81)
RDW: 17.9 % — ABNORMAL HIGH (ref 11.5–15.5)
WBC: 7.2 10*3/uL (ref 4.0–10.5)
nRBC: 0 % (ref 0.0–0.2)

## 2019-02-10 LAB — TYPE AND SCREEN
ABO/RH(D): A POS
Antibody Screen: NEGATIVE
Unit division: 0
Unit division: 0
Unit division: 0
Unit division: 0

## 2019-02-10 LAB — BASIC METABOLIC PANEL
Anion gap: 4 — ABNORMAL LOW (ref 5–15)
BUN: 5 mg/dL — ABNORMAL LOW (ref 6–20)
CO2: 28 mmol/L (ref 22–32)
Calcium: 7.7 mg/dL — ABNORMAL LOW (ref 8.9–10.3)
Chloride: 106 mmol/L (ref 98–111)
Creatinine, Ser: 1.09 mg/dL (ref 0.61–1.24)
GFR calc Af Amer: >60 mL/min (ref >60)
GFR calc non Af Amer: >60 mL/min (ref >60)
Glucose, Bld: 102 mg/dL — ABNORMAL HIGH (ref 70–99)
Potassium: 3.8 mmol/L (ref 3.5–5.1)
Sodium: 138 mmol/L (ref 135–145)

## 2019-02-10 LAB — BPAM RBC
Blood Product Expiration Date: 202009192359
Blood Product Expiration Date: 202009192359
Blood Product Expiration Date: 202010032359
Blood Product Expiration Date: 202010032359
ISSUE DATE / TIME: 202008282024
ISSUE DATE / TIME: 202008282024
ISSUE DATE / TIME: 202008310606
ISSUE DATE / TIME: 202008310946
Unit Type and Rh: 5100
Unit Type and Rh: 5100
Unit Type and Rh: 6200
Unit Type and Rh: 6200

## 2019-02-10 LAB — HEPARIN LEVEL (UNFRACTIONATED): Heparin Unfractionated: 0.11 IU/mL — ABNORMAL LOW (ref 0.30–0.70)

## 2019-02-10 LAB — APTT: aPTT: 48 seconds — ABNORMAL HIGH (ref 24–36)

## 2019-02-10 SURGERY — FASCIOTOMY CLOSURE
Anesthesia: General | Laterality: Left

## 2019-02-10 MED ORDER — OXYCODONE HCL 5 MG PO TABS
ORAL_TABLET | ORAL | Status: AC
  Filled 2019-02-10: qty 1

## 2019-02-10 MED ORDER — DEXAMETHASONE SODIUM PHOSPHATE 10 MG/ML IJ SOLN
INTRAMUSCULAR | Status: DC | PRN
  Administered 2019-02-10: 8 mg via INTRAVENOUS

## 2019-02-10 MED ORDER — MIDAZOLAM HCL 5 MG/5ML IJ SOLN
INTRAMUSCULAR | Status: DC | PRN
  Administered 2019-02-10: 2 mg via INTRAVENOUS

## 2019-02-10 MED ORDER — 0.9 % SODIUM CHLORIDE (POUR BTL) OPTIME
TOPICAL | Status: DC | PRN
  Administered 2019-02-10: 12:00:00 1000 mL

## 2019-02-10 MED ORDER — FENTANYL CITRATE (PF) 250 MCG/5ML IJ SOLN
INTRAMUSCULAR | Status: DC | PRN
  Administered 2019-02-10: 100 ug via INTRAVENOUS
  Administered 2019-02-10: 50 ug via INTRAVENOUS
  Administered 2019-02-10: 100 ug via INTRAVENOUS

## 2019-02-10 MED ORDER — PROMETHAZINE HCL 25 MG/ML IJ SOLN: 6.2500 mg | INTRAMUSCULAR | Status: DC | PRN

## 2019-02-10 MED ORDER — ACETAMINOPHEN 10 MG/ML IV SOLN
INTRAVENOUS | Status: AC
  Filled 2019-02-10: qty 100

## 2019-02-10 MED ORDER — PROPOFOL 10 MG/ML IV BOLUS
INTRAVENOUS | Status: DC | PRN
  Administered 2019-02-10: 200 mg via INTRAVENOUS

## 2019-02-10 MED ORDER — ACETAMINOPHEN 10 MG/ML IV SOLN: 1000.0000 mg | Freq: Once | INTRAVENOUS | Status: DC | PRN

## 2019-02-10 MED ORDER — LACTATED RINGERS IV SOLN
INTRAVENOUS | Status: DC | PRN
  Administered 2019-02-10: 12:00:00 via INTRAVENOUS

## 2019-02-10 MED ORDER — OXYCODONE HCL 5 MG PO TABS
5.0000 mg | ORAL_TABLET | Freq: Once | ORAL | Status: AC | PRN
  Administered 2019-02-10: 5 mg via ORAL

## 2019-02-10 MED ORDER — FENTANYL CITRATE (PF) 100 MCG/2ML IJ SOLN
INTRAMUSCULAR | Status: AC
  Filled 2019-02-10: qty 2

## 2019-02-10 MED ORDER — OXYCODONE HCL 5 MG/5ML PO SOLN: 5.0000 mg | Freq: Once | ORAL | Status: AC | PRN

## 2019-02-10 MED ORDER — CEFAZOLIN SODIUM-DEXTROSE 2-4 GM/100ML-% IV SOLN
INTRAVENOUS | Status: AC
  Filled 2019-02-10: qty 100

## 2019-02-10 MED ORDER — FENTANYL CITRATE (PF) 100 MCG/2ML IJ SOLN
25.0000 ug | INTRAMUSCULAR | Status: DC | PRN
  Administered 2019-02-10: 50 ug via INTRAVENOUS

## 2019-02-10 MED ORDER — CEFAZOLIN SODIUM-DEXTROSE 2-3 GM-%(50ML) IV SOLR
INTRAVENOUS | Status: DC | PRN
  Administered 2019-02-10: 2 g via INTRAVENOUS

## 2019-02-10 MED ORDER — LIDOCAINE HCL (CARDIAC) PF 100 MG/5ML IV SOSY
PREFILLED_SYRINGE | INTRAVENOUS | Status: DC | PRN
  Administered 2019-02-10: 60 mg via INTRAVENOUS
  Administered 2019-02-10: 40 mg via INTRAVENOUS

## 2019-02-10 SURGICAL SUPPLY — 40 items
BAG ISOLATION DRAPE 18X18 (DRAPES) IMPLANT
BNDG ELASTIC 4X5.8 VLCR STR LF (GAUZE/BANDAGES/DRESSINGS) ×3 IMPLANT
BNDG ELASTIC 6X10 VLCR STRL LF (GAUZE/BANDAGES/DRESSINGS) ×3 IMPLANT
BNDG ELASTIC 6X5.8 VLCR STR LF (GAUZE/BANDAGES/DRESSINGS) IMPLANT
BNDG GAUZE ELAST 4 BULKY (GAUZE/BANDAGES/DRESSINGS) ×3 IMPLANT
CANISTER SUCT 3000ML PPV (MISCELLANEOUS) ×3 IMPLANT
COVER WAND RF STERILE (DRAPES) ×3 IMPLANT
DRAPE EXTREMITY BILATERAL (DRAPES) IMPLANT
DRAPE EXTREMITY T 121X128X90 (DISPOSABLE) ×3 IMPLANT
DRAPE HALF SHEET 40X57 (DRAPES) IMPLANT
DRAPE ISOLATION BAG 18X18 (DRAPES)
DRAPE U-SHAPE 76X120 STRL (DRAPES) IMPLANT
DRSG XEROFORM 1X8 (GAUZE/BANDAGES/DRESSINGS) ×3 IMPLANT
ELECT REM PT RETURN 9FT ADLT (ELECTROSURGICAL) ×3
ELECTRODE REM PT RTRN 9FT ADLT (ELECTROSURGICAL) ×1 IMPLANT
GAUZE SPONGE 4X4 12PLY STRL (GAUZE/BANDAGES/DRESSINGS) ×3 IMPLANT
GAUZE SPONGE 4X4 12PLY STRL LF (GAUZE/BANDAGES/DRESSINGS) ×3 IMPLANT
GAUZE XEROFORM 5X9 LF (GAUZE/BANDAGES/DRESSINGS) IMPLANT
GLOVE BIO SURGEON STRL SZ7.5 (GLOVE) ×3 IMPLANT
GLOVE BIOGEL M 6.5 STRL (GLOVE) ×6 IMPLANT
GLOVE BIOGEL PI IND STRL 6.5 (GLOVE) ×2 IMPLANT
GLOVE BIOGEL PI INDICATOR 6.5 (GLOVE) ×4
GOWN STRL REUS W/ TWL LRG LVL3 (GOWN DISPOSABLE) ×2 IMPLANT
GOWN STRL REUS W/ TWL XL LVL3 (GOWN DISPOSABLE) ×1 IMPLANT
GOWN STRL REUS W/TWL LRG LVL3 (GOWN DISPOSABLE) ×4
GOWN STRL REUS W/TWL XL LVL3 (GOWN DISPOSABLE) ×2
KIT BASIN OR (CUSTOM PROCEDURE TRAY) ×3 IMPLANT
KIT TURNOVER KIT B (KITS) ×3 IMPLANT
NS IRRIG 1000ML POUR BTL (IV SOLUTION) ×3 IMPLANT
PACK CV ACCESS (CUSTOM PROCEDURE TRAY) IMPLANT
PACK GENERAL/GYN (CUSTOM PROCEDURE TRAY) ×3 IMPLANT
PACK UNIVERSAL I (CUSTOM PROCEDURE TRAY) ×3 IMPLANT
PAD ARMBOARD 7.5X6 YLW CONV (MISCELLANEOUS) ×6 IMPLANT
SUT ETHILON 3 0 PS 1 (SUTURE) IMPLANT
SUT VIC AB 2-0 CTX 36 (SUTURE) IMPLANT
SUT VIC AB 3-0 SH 27 (SUTURE)
SUT VIC AB 3-0 SH 27X BRD (SUTURE) IMPLANT
SUT VICRYL 4-0 PS2 18IN ABS (SUTURE) IMPLANT
TOWEL GREEN STERILE (TOWEL DISPOSABLE) ×3 IMPLANT
WATER STERILE IRR 1000ML POUR (IV SOLUTION) ×3 IMPLANT

## 2019-02-10 NOTE — Anesthesia Procedure Notes (Signed)
Procedure Name: LMA Insertion Date/Time: 02/10/2019 12:02 PM Performed by: Mariea Clonts, CRNA Pre-anesthesia Checklist: Patient identified, Emergency Drugs available, Suction available and Patient being monitored Patient Re-evaluated:Patient Re-evaluated prior to induction Oxygen Delivery Method: Circle System Utilized Preoxygenation: Pre-oxygenation with 100% oxygen Induction Type: IV induction Ventilation: Mask ventilation without difficulty LMA: LMA inserted LMA Size: 5.0 Number of attempts: 1 Airway Equipment and Method: Bite block Placement Confirmation: positive ETCO2 Tube secured with: Tape Dental Injury: Teeth and Oropharynx as per pre-operative assessment

## 2019-02-10 NOTE — Progress Notes (Signed)
Patient came to Short Stay with heparin running. Notified Dr. Donzetta Matters of same he is ok with it continuing to run.
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
## 2019-02-10 NOTE — Anesthesia Postprocedure Evaluation (Signed)
Anesthesia Post Note  Patient: Tyler Rowland  Procedure(s) Performed: FASCIOTOMY CLOSURE (Left )     Patient location during evaluation: PACU Anesthesia Type: General Level of consciousness: awake and alert and oriented Pain management: pain level controlled Vital Signs Assessment: post-procedure vital signs reviewed and stable Respiratory status: spontaneous breathing, nonlabored ventilation and respiratory function stable Cardiovascular status: blood pressure returned to baseline Postop Assessment: no apparent nausea or vomiting Anesthetic complications: no    Last Vitals:  Vitals:   02/10/19 1250 02/10/19 1305  BP: 119/73 121/70  Pulse: 78 77  Resp: 13 11  Temp: (!) 36.3 C   SpO2: 100% 100%    Last Pain:  Vitals:   02/10/19 1305  TempSrc:   PainSc: [place]

## 2019-02-10 NOTE — Progress Notes (Signed)
Left calf wound dressing changed per MD order without difficulty.  Will continue to monitor.

## 2019-02-10 NOTE — Progress Notes (Signed)
Patient arrived from PACU to 4e09, patient placed on the monitor and vital signs obtained. Left leg lower aspect wrapped with ace. Thigh dressings clean dry and intact at this time.  Will monitor patient. Emiliano Welshans, Bettina Gavia rN

## 2019-02-10 NOTE — TOC Benefit Eligibility Note (Signed)
Transition of Care [facility]) Benefit Eligibility Note    Patient Details  Name: Tyler Rowland MRN: [id number] Date of Birth: [birth year]   Medication/Dose: ELIQUIS  5 MG  BID   AND   XARELTO 20 MG DAILY  Covered?: Yes  Tier: 3 Drug  Prescription Coverage Preferred Pharmacy: [facility] with Person/Company/Phone Number:: MARIO  @  [employer] # [phone]  Co-Pay: $ 25.00  Prior Approval: No  Deductible: Unmet       Memory Argue Phone Number: 02/10/2019, 11:57 AM

## 2019-02-10 NOTE — Transfer of Care (Signed)
Immediate Anesthesia Transfer of Care Note  Patient: Tyler Rowland  Procedure(s) Performed: FASCIOTOMY CLOSURE (Left )  Patient Location: PACU  Anesthesia Type:General  Level of Consciousness: awake, alert  and oriented  Airway & Oxygen Therapy: Patient Spontanous Breathing and Patient connected to nasal cannula oxygen  Post-op Assessment: Report given to RN, Post -op Vital signs reviewed and stable and Patient moving all extremities X 4  Post vital signs: Reviewed and stable  Last Vitals:  Vitals Value Taken Time  BP 119/73 02/10/19 1250  Temp    Pulse 78 02/10/19 1254  Resp 11 02/10/19 1254  SpO2 100 % 02/10/19 1254  Vitals shown include unvalidated device data.  Last Pain:  Vitals:   02/10/19 1250  TempSrc:   PainSc: (P) Asleep      Patients Stated Pain Goal: 0 (36/14/43 1540)  Complications: No apparent anesthesia complications
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
## 2019-02-11 ENCOUNTER — Inpatient Hospital Stay (HOSPITAL_COMMUNITY): Payer: Managed Care, Other (non HMO)

## 2019-02-11 ENCOUNTER — Encounter (HOSPITAL_COMMUNITY): Payer: Self-pay | Admitting: Vascular Surgery

## 2019-02-11 DIAGNOSIS — S85092A Other specified injury of popliteal artery, left leg, initial encounter: Secondary | ICD-10-CM

## 2019-02-11 DIAGNOSIS — W3400XA Accidental discharge from unspecified firearms or gun, initial encounter: Secondary | ICD-10-CM

## 2019-02-11 LAB — CBC
HCT: 20.2 % — ABNORMAL LOW (ref 39.0–52.0)
Hemoglobin: 6.7 g/dL — CL (ref 13.0–17.0)
MCH: 28.2 pg (ref 26.0–34.0)
MCHC: 33.2 g/dL (ref 30.0–36.0)
MCV: 84.9 fL (ref 80.0–100.0)
Platelets: 190 10*3/uL (ref 150–400)
RBC: 2.38 MIL/uL — ABNORMAL LOW (ref 4.22–5.81)
RDW: 16.6 % — ABNORMAL HIGH (ref 11.5–15.5)
WBC: 7.3 10*3/uL (ref 4.0–10.5)
nRBC: 0 % (ref 0.0–0.2)

## 2019-02-11 LAB — HEMOGLOBIN AND HEMATOCRIT, BLOOD
HCT: 24.9 % — ABNORMAL LOW (ref 39.0–52.0)
Hemoglobin: 8.5 g/dL — ABNORMAL LOW (ref 13.0–17.0)

## 2019-02-11 LAB — PREPARE RBC (CROSSMATCH)

## 2019-02-11 LAB — APTT: aPTT: 29 seconds (ref 24–36)

## 2019-02-11 MED ORDER — RIVAROXABAN (XARELTO) VTE STARTER PACK (15 & 20 MG): ORAL_TABLET | ORAL | 0 refills | Status: DC

## 2019-02-11 MED ORDER — RIVAROXABAN 20 MG PO TABS: 20.0000 mg | ORAL_TABLET | Freq: Every day | ORAL | 3 refills | Status: DC

## 2019-02-11 MED ORDER — OXYCODONE-ACETAMINOPHEN 5-325 MG PO TABS: 1.0000 | ORAL_TABLET | Freq: Four times a day (QID) | ORAL | 0 refills | Status: DC | PRN

## 2019-02-11 MED ORDER — RIVAROXABAN 15 MG PO TABS
15.0000 mg | ORAL_TABLET | Freq: Once | ORAL | Status: AC
  Administered 2019-02-11: 15 mg via ORAL
  Filled 2019-02-11: qty 1

## 2019-02-11 MED ORDER — ASPIRIN 81 MG PO TBEC: 81.0000 mg | DELAYED_RELEASE_TABLET | Freq: Every day | ORAL | Status: DC

## 2019-02-11 MED ORDER — SODIUM CHLORIDE 0.9% IV SOLUTION
Freq: Once | INTRAVENOUS | Status: AC
  Administered 2019-02-11: 11:00:00 via INTRAVENOUS

## 2019-02-11 MED FILL — OXYCODONE W/APAP 5/325 TAB: 5-325 | 7 days supply | Qty: 30 | Fill #0

## 2019-02-11 MED FILL — XARELTO STARTER PACK: 15 & 20 | 30 days supply | Qty: 51 | Fill #0

## 2019-02-11 MED FILL — XARELTO 20 MG TABLET: 20 | 30 days supply | Qty: 30 | Fill #0
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
## 2019-02-11 NOTE — Progress Notes (Signed)
Post operative ABIs completed. Preliminary results in Chart review CV Proc. [facility], [place] 02/11/2019, 11:56 AM

## 2019-02-11 NOTE — Progress Notes (Signed)
CRITICAL VALUE ALERT  Critical Value:  Hgb 6.7  Date & Time Notied: 02/11/2019   06:35   Provider Notified: Dr. Scot Dock while on floor seeing pt.     Orders Received/Actions taken: Dr. Scot Dock to place orders - plans to transfuse 1 unit.

## 2019-02-11 NOTE — Progress Notes (Signed)
Orthopedic Tech Progress Note Patient Details:  Tyler Rowland [birth year] [id number]  Ortho Devices Type of Ortho Device: Crutches Ortho Device/Splint Interventions: Adjustment, Application, Ordered   Post Interventions Patient Tolerated: Well   [name] T 02/11/2019, 7:25 PM
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
## 2019-02-11 NOTE — Progress Notes (Signed)
Pt has been given his discharge education, IVs have been removed and his home crutches are with him.  Pt now being rolled out with his sister.  Lupita Dawn, RN

## 2019-02-12 ENCOUNTER — Encounter: Payer: Self-pay | Admitting: Nurse Practitioner

## 2019-02-12 LAB — BPAM RBC
Blood Product Expiration Date: 202009092359
ISSUE DATE / TIME: 202009021211
Unit Type and Rh: 6200

## 2019-02-12 LAB — TYPE AND SCREEN
ABO/RH(D): A POS
Antibody Screen: NEGATIVE
Unit division: 0

## 2019-02-25 ENCOUNTER — Encounter: Payer: Self-pay | Admitting: Family

## 2019-02-25 ENCOUNTER — Encounter: Payer: Self-pay | Admitting: *Deleted

## 2019-02-25 ENCOUNTER — Ambulatory Visit (INDEPENDENT_AMBULATORY_CARE_PROVIDER_SITE_OTHER): Payer: Self-pay | Admitting: Family

## 2019-02-25 ENCOUNTER — Other Ambulatory Visit: Payer: Self-pay

## 2019-02-25 VITALS — BP 106/73 | HR 75 | Temp 98.1°F | Resp 14 | Ht 75.0 in | Wt 207.2 lb

## 2019-02-25 DIAGNOSIS — W3400XD Accidental discharge from unspecified firearms or gun, subsequent encounter: Secondary | ICD-10-CM

## 2019-02-25 DIAGNOSIS — S71132D Puncture wound without foreign body, left thigh, subsequent encounter: Secondary | ICD-10-CM
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
## 2019-03-06 ENCOUNTER — Telehealth: Payer: Self-pay

## 2019-03-06 NOTE — Telephone Encounter (Signed)
Cecille Rubin - nurse case manager with [facility] called and requested we make patient an appt to come in and be seen by Dr Scot Dock as he is complaining of increased pain and decreased function of his left leg.   Called patient and he asked if this was different concerns than he had when he was in the office the other day seeing the NP as I discussed with him in detail things that he could do to help with swelling and pain. He said that it is because he really didn't know what to expect then and he was having new symptoms. He states that he has a couple of painful knots in his legs and that he is not able to use the leg as he thought he should.   He said that he is suppose to go back to work next month on the 19th and really doesn't think that he can.   Appt made for pt to be seen for a follow up with Scot Dock per his request.   York Cerise, CMA

## 2019-03-11 ENCOUNTER — Encounter: Payer: Self-pay | Admitting: Vascular Surgery

## 2019-03-11 ENCOUNTER — Other Ambulatory Visit: Payer: Self-pay

## 2019-03-11 ENCOUNTER — Ambulatory Visit (INDEPENDENT_AMBULATORY_CARE_PROVIDER_SITE_OTHER): Payer: Self-pay | Admitting: Vascular Surgery

## 2019-03-11 VITALS — BP 123/76 | HR 82 | Temp 97.8°F | Resp 20 | Ht 75.0 in | Wt 209.9 lb

## 2019-03-11 DIAGNOSIS — S71132D Puncture wound without foreign body, left thigh, subsequent encounter: Secondary | ICD-10-CM

## 2019-03-11 DIAGNOSIS — W3400XD Accidental discharge from unspecified firearms or gun, subsequent encounter: Secondary | ICD-10-CM
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
## 2019-03-19 ENCOUNTER — Telehealth: Payer: Self-pay | Admitting: Nurse Practitioner

## 2019-03-19 NOTE — Telephone Encounter (Signed)
Questions for Screening COVID-19  Symptom onset: n/a  Travel or Contacts: no  During this illness, did/does the patient experience any of the following symptoms? Fever >100.60F []   Yes [x]   No []   Unknown Subjective fever (felt feverish) []   Yes [x]   No []   Unknown Chills []   Yes [x]   No []   Unknown Muscle aches (myalgia) []   Yes [x]   No []   Unknown Runny nose (rhinorrhea) []   Yes [x]   No []   Unknown Sore throat []   Yes [x]   No []   Unknown Cough (new onset or worsening of chronic cough) []   Yes [x]   No []   Unknown Shortness of breath (dyspnea) []   Yes [x]   No []   Unknown Nausea or vomiting []   Yes [x]   No []   Unknown Headache []   Yes [x]   No []   Unknown Abdominal pain  []   Yes [x]   No []   Unknown Diarrhea (?3 loose/looser than normal stools/24hr period) []   Yes []   No []   Unknown Other, specify:

## 2019-03-20 ENCOUNTER — Encounter: Payer: Self-pay | Admitting: Nurse Practitioner

## 2019-03-20 ENCOUNTER — Ambulatory Visit (INDEPENDENT_AMBULATORY_CARE_PROVIDER_SITE_OTHER): Payer: Managed Care, Other (non HMO) | Admitting: Nurse Practitioner

## 2019-03-20 ENCOUNTER — Other Ambulatory Visit: Payer: Self-pay

## 2019-03-20 VITALS — BP 120/65 | HR 88 | Temp 97.3°F | Ht 75.0 in | Wt 214.4 lb

## 2019-03-20 DIAGNOSIS — M79605 Pain in left leg: Secondary | ICD-10-CM | POA: Diagnosis not present

## 2019-03-20 DIAGNOSIS — W3400XA Accidental discharge from unspecified firearms or gun, initial encounter: Secondary | ICD-10-CM | POA: Diagnosis not present

## 2019-03-20 NOTE — Patient Instructions (Signed)
Return to work in 6weeks as directed by Vein and vascular specialist. I dod not think PT is needed at this time. Do not take any NSAIDs or Asipirin OTC while on xarelto. You can only use tylenol 500mg  every 6hrs as needed for pain.  Call office if any signs of GI or GU bleed. Call vein and vascular if you develop fever, worsening pain/swelling, leg weakness.  Elevate left leg when sitting or laying down.
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
## 2019-03-25 ENCOUNTER — Telehealth: Payer: Self-pay | Admitting: *Deleted

## 2019-03-25 ENCOUNTER — Other Ambulatory Visit: Payer: Self-pay | Admitting: *Deleted

## 2019-03-25 DIAGNOSIS — S71132D Puncture wound without foreign body, left thigh, subsequent encounter: Secondary | ICD-10-CM

## 2019-03-25 DIAGNOSIS — W3400XD Accidental discharge from unspecified firearms or gun, subsequent encounter: Secondary | ICD-10-CM
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
## 2019-04-14 ENCOUNTER — Ambulatory Visit: Payer: Managed Care, Other (non HMO) | Admitting: Physical Therapy

## 2019-04-20 ENCOUNTER — Encounter: Payer: Self-pay | Admitting: Physical Therapy

## 2019-04-20 ENCOUNTER — Ambulatory Visit: Payer: Managed Care, Other (non HMO) | Attending: Vascular Surgery | Admitting: Physical Therapy

## 2019-04-20 ENCOUNTER — Other Ambulatory Visit: Payer: Self-pay

## 2019-04-20 DIAGNOSIS — M25662 Stiffness of left knee, not elsewhere classified: Secondary | ICD-10-CM | POA: Diagnosis present

## 2019-04-20 DIAGNOSIS — M79662 Pain in left lower leg: Secondary | ICD-10-CM | POA: Diagnosis present

## 2019-04-20 DIAGNOSIS — R600 Localized edema: Secondary | ICD-10-CM | POA: Diagnosis present
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
## 2019-04-26 ENCOUNTER — Other Ambulatory Visit: Payer: Self-pay

## 2019-04-26 ENCOUNTER — Encounter (HOSPITAL_COMMUNITY): Payer: Self-pay

## 2019-04-26 ENCOUNTER — Emergency Department (HOSPITAL_COMMUNITY)
Admission: EM | Admit: 2019-04-26 | Discharge: 2019-04-26 | Disposition: A | Discharge status: Home / Self Care | Payer: Managed Care, Other (non HMO) | Attending: Emergency Medicine | Admitting: Emergency Medicine

## 2019-04-26 DIAGNOSIS — M79605 Pain in left leg: Secondary | ICD-10-CM | POA: Insufficient documentation

## 2019-04-26 DIAGNOSIS — S161XXA Strain of muscle, fascia and tendon at neck level, initial encounter: Secondary | ICD-10-CM | POA: Insufficient documentation

## 2019-04-26 DIAGNOSIS — S199XXA Unspecified injury of neck, initial encounter: Secondary | ICD-10-CM | POA: Diagnosis present

## 2019-04-26 DIAGNOSIS — S39012A Strain of muscle, fascia and tendon of lower back, initial encounter: Secondary | ICD-10-CM | POA: Diagnosis not present

## 2019-04-26 DIAGNOSIS — Y999 Unspecified external cause status: Secondary | ICD-10-CM | POA: Diagnosis not present

## 2019-04-26 DIAGNOSIS — Y9389 Activity, other specified: Secondary | ICD-10-CM | POA: Insufficient documentation

## 2019-04-26 DIAGNOSIS — Y9289 Other specified places as the place of occurrence of the external cause: Secondary | ICD-10-CM | POA: Diagnosis not present

## 2019-04-26 MED ORDER — CYCLOBENZAPRINE HCL 10 MG PO TABS: 10.0000 mg | ORAL_TABLET | Freq: Two times a day (BID) | ORAL | 0 refills | Status: DC | PRN
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
## 2019-05-01 ENCOUNTER — Ambulatory Visit: Payer: Managed Care, Other (non HMO) | Admitting: Physical Therapy

## 2019-05-01 ENCOUNTER — Other Ambulatory Visit: Payer: Self-pay

## 2019-05-01 DIAGNOSIS — M79662 Pain in left lower leg: Secondary | ICD-10-CM

## 2019-05-01 DIAGNOSIS — R600 Localized edema: Secondary | ICD-10-CM

## 2019-05-01 DIAGNOSIS — M25662 Stiffness of left knee, not elsewhere classified: Secondary | ICD-10-CM
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
## 2019-05-03 ENCOUNTER — Other Ambulatory Visit: Payer: Self-pay

## 2019-05-03 ENCOUNTER — Emergency Department (HOSPITAL_COMMUNITY): Payer: Managed Care, Other (non HMO)

## 2019-05-03 ENCOUNTER — Emergency Department (HOSPITAL_BASED_OUTPATIENT_CLINIC_OR_DEPARTMENT_OTHER): Payer: Managed Care, Other (non HMO)

## 2019-05-03 ENCOUNTER — Emergency Department (HOSPITAL_COMMUNITY)
Admission: EM | Admit: 2019-05-03 | Discharge: 2019-05-03 | Disposition: A | Discharge status: Home / Self Care | Payer: Managed Care, Other (non HMO) | Attending: Emergency Medicine | Admitting: Emergency Medicine

## 2019-05-03 DIAGNOSIS — G8911 Acute pain due to trauma: Secondary | ICD-10-CM | POA: Diagnosis not present

## 2019-05-03 DIAGNOSIS — M79605 Pain in left leg: Secondary | ICD-10-CM

## 2019-05-03 DIAGNOSIS — R52 Pain, unspecified: Secondary | ICD-10-CM | POA: Diagnosis not present

## 2019-05-03 DIAGNOSIS — G8918 Other acute postprocedural pain: Secondary | ICD-10-CM | POA: Diagnosis not present

## 2019-05-03 DIAGNOSIS — M79662 Pain in left lower leg: Secondary | ICD-10-CM | POA: Diagnosis present

## 2019-05-03 MED ORDER — HYDROCODONE-ACETAMINOPHEN 5-325 MG PO TABS
1.0000 | ORAL_TABLET | Freq: Once | ORAL | Status: AC
  Administered 2019-05-03: 1 via ORAL
  Filled 2019-05-03: qty 1

## 2019-05-03 NOTE — ED Notes (Signed)
US at bedside

## 2019-05-03 NOTE — Discharge Instructions (Signed)
Please take ibuprofen and Tylenol together to help treat pain in your leg.  You can also use over-the-counter muscle rubs, ice and elevate the leg.  Please follow-up with your vascular surgeon, and I would like for you to follow-up with orthopedics as well if pain continues.  Return to the ED for any new or worsening symptoms.

## 2019-05-03 NOTE — ED Notes (Signed)
Patient verbalizes understanding of discharge instructions. Opportunity for questioning and answers were provided. Armband removed by staff, pt discharged from ED.  
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
## 2019-05-03 NOTE — Progress Notes (Signed)
VASCULAR LAB PRELIMINARY  PRELIMINARY  PRELIMINARY  PRELIMINARY  Left lower extremity venous duplex completed.    Preliminary report:  See CV proc for preliminary results.   Keatyn Jawad, RVT 05/03/2019, 4:55 PM

## 2019-05-05 ENCOUNTER — Ambulatory Visit: Payer: Managed Care, Other (non HMO) | Admitting: Physical Therapy

## 2019-05-11 ENCOUNTER — Other Ambulatory Visit: Payer: Self-pay

## 2019-05-11 ENCOUNTER — Ambulatory Visit: Payer: Managed Care, Other (non HMO) | Admitting: Physical Therapy

## 2019-05-11 ENCOUNTER — Encounter: Payer: Self-pay | Admitting: Physical Therapy

## 2019-05-11 DIAGNOSIS — M79662 Pain in left lower leg: Secondary | ICD-10-CM

## 2019-05-11 DIAGNOSIS — M25662 Stiffness of left knee, not elsewhere classified: Secondary | ICD-10-CM

## 2019-05-11 DIAGNOSIS — R600 Localized edema: Secondary | ICD-10-CM
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
## 2019-05-13 ENCOUNTER — Ambulatory Visit: Payer: Managed Care, Other (non HMO) | Admitting: Vascular Surgery

## 2019-05-13 ENCOUNTER — Ambulatory Visit: Payer: Managed Care, Other (non HMO) | Admitting: Physical Therapy

## 2019-05-15 ENCOUNTER — Other Ambulatory Visit: Payer: Self-pay

## 2019-05-15 ENCOUNTER — Ambulatory Visit (INDEPENDENT_AMBULATORY_CARE_PROVIDER_SITE_OTHER): Payer: Managed Care, Other (non HMO) | Admitting: Physician Assistant

## 2019-05-15 VITALS — BP 127/82 | HR 75 | Temp 97.8°F | Resp 16 | Ht 74.0 in | Wt 231.0 lb

## 2019-05-15 DIAGNOSIS — M79605 Pain in left leg: Secondary | ICD-10-CM

## 2019-05-15 DIAGNOSIS — W3400XA Accidental discharge from unspecified firearms or gun, initial encounter: Secondary | ICD-10-CM | POA: Diagnosis not present
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
## 2019-05-18 ENCOUNTER — Encounter: Payer: Self-pay | Admitting: Physical Therapy

## 2019-05-18 ENCOUNTER — Ambulatory Visit: Payer: Managed Care, Other (non HMO) | Attending: Vascular Surgery | Admitting: Physical Therapy

## 2019-05-18 ENCOUNTER — Other Ambulatory Visit: Payer: Self-pay

## 2019-05-18 DIAGNOSIS — M25662 Stiffness of left knee, not elsewhere classified: Secondary | ICD-10-CM | POA: Insufficient documentation

## 2019-05-18 DIAGNOSIS — M79662 Pain in left lower leg: Secondary | ICD-10-CM | POA: Diagnosis not present

## 2019-05-18 DIAGNOSIS — R600 Localized edema: Secondary | ICD-10-CM | POA: Diagnosis present
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
## 2019-05-20 ENCOUNTER — Ambulatory Visit: Payer: Managed Care, Other (non HMO) | Admitting: Physical Therapy

## 2019-05-22 ENCOUNTER — Encounter: Payer: Self-pay | Admitting: Orthopedic Surgery

## 2019-05-22 ENCOUNTER — Ambulatory Visit (INDEPENDENT_AMBULATORY_CARE_PROVIDER_SITE_OTHER): Payer: Managed Care, Other (non HMO) | Admitting: Orthopedic Surgery

## 2019-05-22 ENCOUNTER — Other Ambulatory Visit: Payer: Self-pay

## 2019-05-22 DIAGNOSIS — Z9889 Other specified postprocedural states: Secondary | ICD-10-CM | POA: Diagnosis not present
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
## 2019-05-25 ENCOUNTER — Other Ambulatory Visit: Payer: Self-pay

## 2019-05-25 ENCOUNTER — Ambulatory Visit: Payer: Managed Care, Other (non HMO) | Admitting: Physical Therapy

## 2019-05-25 ENCOUNTER — Encounter: Payer: Self-pay | Admitting: Physical Therapy

## 2019-05-25 DIAGNOSIS — M79662 Pain in left lower leg: Secondary | ICD-10-CM | POA: Diagnosis not present

## 2019-05-25 DIAGNOSIS — R600 Localized edema: Secondary | ICD-10-CM

## 2019-05-25 DIAGNOSIS — M25662 Stiffness of left knee, not elsewhere classified: Secondary | ICD-10-CM
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
## 2019-05-25 NOTE — Patient Instructions (Signed)
Hip Flexor Stretch    Lying on back near edge of bed, bend one leg, foot flat. Hang other leg over edge, relaxed, thigh resting entirely on bed for ___2_ minutes. Repeat __3__ times. Do _1-2___ sessions per day. Advanced Exercise: Bend knee back keeping thigh in contact with bed.  http://[email or web address].exer.us/346   PULL RT KNEE UP TO INCREASE STRETCH> KEEP BACK FLAT  Copyright  VHI. All rights reserved.

## 2019-05-26 ENCOUNTER — Ambulatory Visit: Payer: Managed Care, Other (non HMO) | Admitting: Physical Therapy

## 2019-05-27 ENCOUNTER — Ambulatory Visit: Payer: Managed Care, Other (non HMO) | Admitting: Physical Therapy

## 2019-05-27 ENCOUNTER — Encounter: Payer: Self-pay | Admitting: Physical Therapy

## 2019-05-27 ENCOUNTER — Other Ambulatory Visit: Payer: Self-pay

## 2019-05-27 DIAGNOSIS — M25662 Stiffness of left knee, not elsewhere classified: Secondary | ICD-10-CM

## 2019-05-27 DIAGNOSIS — R600 Localized edema: Secondary | ICD-10-CM

## 2019-05-27 DIAGNOSIS — M79662 Pain in left lower leg: Secondary | ICD-10-CM
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
## 2019-06-03 ENCOUNTER — Ambulatory Visit: Payer: Managed Care, Other (non HMO) | Admitting: Physical Therapy

## 2019-06-08 ENCOUNTER — Other Ambulatory Visit: Payer: Self-pay | Admitting: *Deleted

## 2019-06-08 ENCOUNTER — Ambulatory Visit: Payer: Managed Care, Other (non HMO) | Admitting: Physical Therapy

## 2019-06-08 DIAGNOSIS — M79605 Pain in left leg: Secondary | ICD-10-CM

## 2019-06-11 ENCOUNTER — Ambulatory Visit: Payer: Managed Care, Other (non HMO) | Admitting: Physical Therapy

## 2019-06-16 ENCOUNTER — Ambulatory Visit: Payer: Managed Care, Other (non HMO) | Admitting: Physical Therapy

## 2019-06-18 ENCOUNTER — Ambulatory Visit: Payer: Managed Care, Other (non HMO) | Attending: Vascular Surgery | Admitting: Physical Therapy

## 2019-06-18 ENCOUNTER — Other Ambulatory Visit: Payer: Self-pay

## 2019-06-18 ENCOUNTER — Encounter: Payer: Self-pay | Admitting: Physical Therapy

## 2019-06-18 DIAGNOSIS — M25662 Stiffness of left knee, not elsewhere classified: Secondary | ICD-10-CM | POA: Diagnosis present

## 2019-06-18 DIAGNOSIS — M79662 Pain in left lower leg: Secondary | ICD-10-CM | POA: Diagnosis present

## 2019-06-18 DIAGNOSIS — R600 Localized edema: Secondary | ICD-10-CM | POA: Diagnosis present

## 2019-06-18 NOTE — Addendum Note (Signed)
Addended by: [name] L on: 06/18/2019 05:59 PM   Modules accepted: Orders
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
## 2019-06-23 ENCOUNTER — Other Ambulatory Visit: Payer: Self-pay

## 2019-06-23 ENCOUNTER — Encounter: Payer: Self-pay | Admitting: Physical Therapy

## 2019-06-23 ENCOUNTER — Telehealth (HOSPITAL_COMMUNITY): Payer: Self-pay

## 2019-06-23 ENCOUNTER — Ambulatory Visit: Payer: Managed Care, Other (non HMO) | Admitting: Physical Therapy

## 2019-06-23 DIAGNOSIS — M79662 Pain in left lower leg: Secondary | ICD-10-CM | POA: Diagnosis not present

## 2019-06-23 DIAGNOSIS — R600 Localized edema: Secondary | ICD-10-CM

## 2019-06-23 DIAGNOSIS — M25662 Stiffness of left knee, not elsewhere classified: Secondary | ICD-10-CM
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
## 2019-06-23 NOTE — Patient Instructions (Signed)
Heel Raise: Unilateral (Standing)    Balance on left foot, then rise on ball of foot. Repeat _20___ times per set. Do ____2 sets per session. Do __2__ sessions per day.  http://orth.exer.us/40   Copyright  VHI. All rights reserved.

## 2019-06-23 NOTE — Telephone Encounter (Signed)

## 2019-06-24 ENCOUNTER — Encounter (HOSPITAL_COMMUNITY): Payer: Managed Care, Other (non HMO)

## 2019-06-24 ENCOUNTER — Ambulatory Visit (INDEPENDENT_AMBULATORY_CARE_PROVIDER_SITE_OTHER): Payer: Managed Care, Other (non HMO) | Admitting: Vascular Surgery

## 2019-06-24 ENCOUNTER — Encounter: Payer: Self-pay | Admitting: Vascular Surgery

## 2019-06-24 ENCOUNTER — Ambulatory Visit (HOSPITAL_COMMUNITY)
Admission: RE | Admit: 2019-06-24 | Discharge: 2019-06-24 | Disposition: A | Discharge status: Home / Self Care | Payer: Managed Care, Other (non HMO) | Source: Ambulatory Visit | Attending: Vascular Surgery | Admitting: Vascular Surgery

## 2019-06-24 ENCOUNTER — Ambulatory Visit: Payer: Managed Care, Other (non HMO) | Admitting: Vascular Surgery

## 2019-06-24 VITALS — BP 115/81 | HR 72 | Temp 97.7°F | Resp 20 | Ht 74.0 in | Wt 214.0 lb

## 2019-06-24 DIAGNOSIS — M79605 Pain in left leg: Secondary | ICD-10-CM | POA: Diagnosis present

## 2019-06-24 DIAGNOSIS — S71132D Puncture wound without foreign body, left thigh, subsequent encounter: Secondary | ICD-10-CM | POA: Diagnosis not present

## 2019-06-24 DIAGNOSIS — W3400XD Accidental discharge from unspecified firearms or gun, subsequent encounter: Secondary | ICD-10-CM

## 2019-06-24 DIAGNOSIS — W3400XA Accidental discharge from unspecified firearms or gun, initial encounter: Secondary | ICD-10-CM | POA: Diagnosis not present
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
## 2019-06-25 ENCOUNTER — Ambulatory Visit: Payer: Managed Care, Other (non HMO) | Admitting: Physical Therapy

## 2019-06-25 ENCOUNTER — Other Ambulatory Visit: Payer: Self-pay

## 2019-06-25 DIAGNOSIS — M79662 Pain in left lower leg: Secondary | ICD-10-CM | POA: Diagnosis not present

## 2019-06-25 DIAGNOSIS — M25662 Stiffness of left knee, not elsewhere classified: Secondary | ICD-10-CM

## 2019-06-25 DIAGNOSIS — R600 Localized edema: Secondary | ICD-10-CM
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
## 2019-06-26 ENCOUNTER — Encounter: Payer: Self-pay | Admitting: Vascular Surgery

## 2019-06-26 ENCOUNTER — Ambulatory Visit (INDEPENDENT_AMBULATORY_CARE_PROVIDER_SITE_OTHER): Payer: Managed Care, Other (non HMO) | Admitting: Vascular Surgery

## 2019-06-26 ENCOUNTER — Ambulatory Visit: Payer: Managed Care, Other (non HMO)

## 2019-06-26 ENCOUNTER — Ambulatory Visit (HOSPITAL_COMMUNITY)
Admission: RE | Admit: 2019-06-26 | Discharge: 2019-06-26 | Disposition: A | Discharge status: Home / Self Care | Payer: Managed Care, Other (non HMO) | Source: Ambulatory Visit | Attending: Vascular Surgery | Admitting: Vascular Surgery

## 2019-06-26 VITALS — BP 118/75 | HR 82 | Temp 97.7°F | Resp 20 | Ht 74.0 in | Wt 219.0 lb

## 2019-06-26 DIAGNOSIS — T148XXA Other injury of unspecified body region, initial encounter: Secondary | ICD-10-CM | POA: Diagnosis not present

## 2019-06-26 DIAGNOSIS — W3400XA Accidental discharge from unspecified firearms or gun, initial encounter: Secondary | ICD-10-CM | POA: Insufficient documentation

## 2019-06-26 DIAGNOSIS — M79605 Pain in left leg: Secondary | ICD-10-CM

## 2019-06-26 DIAGNOSIS — M7989 Other specified soft tissue disorders: Secondary | ICD-10-CM
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
## 2019-06-30 ENCOUNTER — Other Ambulatory Visit: Payer: Self-pay | Admitting: *Deleted

## 2019-06-30 ENCOUNTER — Ambulatory Visit: Payer: Managed Care, Other (non HMO) | Admitting: Physical Therapy

## 2019-06-30 DIAGNOSIS — M79605 Pain in left leg: Secondary | ICD-10-CM

## 2019-06-30 DIAGNOSIS — W3400XA Accidental discharge from unspecified firearms or gun, initial encounter: Secondary | ICD-10-CM

## 2019-07-02 ENCOUNTER — Encounter: Payer: Self-pay | Admitting: Physical Therapy

## 2019-07-02 ENCOUNTER — Other Ambulatory Visit: Payer: Self-pay

## 2019-07-02 ENCOUNTER — Ambulatory Visit: Payer: Managed Care, Other (non HMO) | Admitting: Physical Therapy

## 2019-07-02 DIAGNOSIS — M79662 Pain in left lower leg: Secondary | ICD-10-CM | POA: Diagnosis not present

## 2019-07-02 DIAGNOSIS — R600 Localized edema: Secondary | ICD-10-CM

## 2019-07-02 DIAGNOSIS — M25662 Stiffness of left knee, not elsewhere classified: Secondary | ICD-10-CM
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
## 2019-07-05 ENCOUNTER — Encounter: Payer: Self-pay | Admitting: Nurse Practitioner

## 2019-07-06 ENCOUNTER — Encounter: Payer: Self-pay | Admitting: Nurse Practitioner

## 2019-07-07 ENCOUNTER — Ambulatory Visit: Payer: Managed Care, Other (non HMO) | Admitting: Physical Therapy

## 2019-07-09 ENCOUNTER — Encounter: Payer: Self-pay | Admitting: Physical Therapy

## 2019-07-09 ENCOUNTER — Ambulatory Visit: Payer: Managed Care, Other (non HMO) | Admitting: Physical Therapy

## 2019-07-09 ENCOUNTER — Other Ambulatory Visit: Payer: Self-pay

## 2019-07-09 DIAGNOSIS — M79662 Pain in left lower leg: Secondary | ICD-10-CM

## 2019-07-09 DIAGNOSIS — R600 Localized edema: Secondary | ICD-10-CM

## 2019-07-09 DIAGNOSIS — M25662 Stiffness of left knee, not elsewhere classified: Secondary | ICD-10-CM
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
## 2019-07-14 ENCOUNTER — Ambulatory Visit: Payer: Managed Care, Other (non HMO) | Admitting: Physical Therapy

## 2019-07-16 ENCOUNTER — Ambulatory Visit: Payer: Managed Care, Other (non HMO) | Admitting: Physical Therapy

## 2019-10-17 ENCOUNTER — Emergency Department (HOSPITAL_BASED_OUTPATIENT_CLINIC_OR_DEPARTMENT_OTHER): Payer: Managed Care, Other (non HMO)

## 2019-10-17 ENCOUNTER — Encounter (HOSPITAL_COMMUNITY): Payer: Self-pay | Admitting: Emergency Medicine

## 2019-10-17 ENCOUNTER — Other Ambulatory Visit: Payer: Self-pay

## 2019-10-17 ENCOUNTER — Emergency Department (HOSPITAL_COMMUNITY)
Admission: EM | Admit: 2019-10-17 | Discharge: 2019-10-17 | Disposition: A | Discharge status: Home / Self Care | Payer: Managed Care, Other (non HMO) | Attending: Emergency Medicine | Admitting: Emergency Medicine

## 2019-10-17 DIAGNOSIS — R609 Edema, unspecified: Secondary | ICD-10-CM | POA: Diagnosis not present

## 2019-10-17 DIAGNOSIS — M79605 Pain in left leg: Secondary | ICD-10-CM | POA: Diagnosis not present

## 2019-10-17 NOTE — Discharge Instructions (Signed)
Try biogel or another topical pain cream on the leg Try an ACE wrap to provide some support and compression  Re-start physical therapy strengthing exercises Please follow up with your doctor
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
## 2019-10-17 NOTE — Progress Notes (Signed)
Lower extremity venous has been completed.   Preliminary results in CV Proc.   Blanch Media 10/17/2019 11:13 AM

## 2019-10-17 NOTE — ED Notes (Signed)
Pt transported to vascular ultrasound 

## 2019-11-01 ENCOUNTER — Encounter (HOSPITAL_COMMUNITY): Payer: Self-pay | Admitting: Emergency Medicine

## 2019-11-01 ENCOUNTER — Emergency Department (HOSPITAL_COMMUNITY): Payer: Managed Care, Other (non HMO)

## 2019-11-01 ENCOUNTER — Emergency Department (HOSPITAL_COMMUNITY)
Admission: EM | Admit: 2019-11-01 | Discharge: 2019-11-01 | Disposition: A | Discharge status: Home / Self Care | Payer: Managed Care, Other (non HMO) | Attending: Emergency Medicine | Admitting: Emergency Medicine

## 2019-11-01 ENCOUNTER — Other Ambulatory Visit: Payer: Self-pay

## 2019-11-01 DIAGNOSIS — R0789 Other chest pain: Secondary | ICD-10-CM | POA: Insufficient documentation

## 2019-11-01 LAB — CBC
HCT: 45.2 % (ref 39.0–52.0)
Hemoglobin: 14.6 g/dL (ref 13.0–17.0)
MCH: 28.9 pg (ref 26.0–34.0)
MCHC: 32.3 g/dL (ref 30.0–36.0)
MCV: 89.5 fL (ref 80.0–100.0)
Platelets: 285 10*3/uL (ref 150–400)
RBC: 5.05 MIL/uL (ref 4.22–5.81)
RDW: 13 % (ref 11.5–15.5)
WBC: 3.8 10*3/uL — ABNORMAL LOW (ref 4.0–10.5)
nRBC: 0 % (ref 0.0–0.2)

## 2019-11-01 LAB — BASIC METABOLIC PANEL
Anion gap: 9 (ref 5–15)
BUN: 7 mg/dL (ref 6–20)
CO2: 25 mmol/L (ref 22–32)
Calcium: 9 mg/dL (ref 8.9–10.3)
Chloride: 105 mmol/L (ref 98–111)
Creatinine, Ser: 1.17 mg/dL (ref 0.61–1.24)
GFR calc Af Amer: >60 mL/min (ref >60)
GFR calc non Af Amer: >60 mL/min (ref >60)
Glucose, Bld: 106 mg/dL — ABNORMAL HIGH (ref 70–99)
Potassium: 4 mmol/L (ref 3.5–5.1)
Sodium: 139 mmol/L (ref 135–145)

## 2019-11-01 LAB — PROTIME-INR
INR: 1 (ref 0.8–1.2)
Prothrombin Time: 12.5 seconds (ref 11.4–15.2)

## 2019-11-01 LAB — D-DIMER, QUANTITATIVE: D-Dimer, Quant: 0.33 ug/mL-FEU (ref 0.00–0.50)

## 2019-11-01 LAB — TROPONIN I (HIGH SENSITIVITY): Troponin I (High Sensitivity): 3 ng/L (ref <18)

## 2019-11-01 MED ORDER — SODIUM CHLORIDE 0.9% FLUSH: 3.0000 mL | Freq: Once | INTRAVENOUS | Status: DC

## 2019-11-01 MED ORDER — SODIUM CHLORIDE 0.9 % IV BOLUS
500.0000 mL | Freq: Once | INTRAVENOUS | Status: AC
  Administered 2019-11-01: 500 mL via INTRAVENOUS

## 2019-11-01 MED ORDER — KETOROLAC TROMETHAMINE 30 MG/ML IJ SOLN
30.0000 mg | Freq: Once | INTRAMUSCULAR | Status: AC
  Administered 2019-11-01: 30 mg via INTRAVENOUS
  Filled 2019-11-01: qty 1

## 2019-11-01 NOTE — ED Triage Notes (Signed)
Patient reports right upper chest pain worse with deep inspiration , denies SOB , no emesis or diaphoresis , denies cough or fever.

## 2019-11-01 NOTE — ED Notes (Signed)
Per MD, Second Troponin is not needed; Pt appropriate for DC
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
## 2019-11-01 NOTE — ED Notes (Signed)
Patient verbalizes understanding of discharge instructions. Opportunity for questioning and answers were provided. Armband removed by staff, pt discharged from ED ambulatory to home.  

## 2019-12-24 ENCOUNTER — Ambulatory Visit (INDEPENDENT_AMBULATORY_CARE_PROVIDER_SITE_OTHER): Payer: Managed Care, Other (non HMO) | Admitting: Physician Assistant

## 2019-12-24 ENCOUNTER — Other Ambulatory Visit: Payer: Self-pay

## 2019-12-24 VITALS — BP 125/86 | HR 67 | Temp 97.7°F | Resp 20 | Ht 74.0 in | Wt 232.2 lb

## 2019-12-24 DIAGNOSIS — W3400XD Accidental discharge from unspecified firearms or gun, subsequent encounter: Secondary | ICD-10-CM

## 2019-12-24 DIAGNOSIS — S71132D Puncture wound without foreign body, left thigh, subsequent encounter: Secondary | ICD-10-CM | POA: Diagnosis not present

## 2019-12-24 DIAGNOSIS — M79605 Pain in left leg: Secondary | ICD-10-CM
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
## 2019-12-27 ENCOUNTER — Encounter (HOSPITAL_COMMUNITY): Payer: Self-pay | Admitting: *Deleted

## 2019-12-27 ENCOUNTER — Other Ambulatory Visit: Payer: Self-pay

## 2019-12-27 ENCOUNTER — Emergency Department (HOSPITAL_COMMUNITY)
Admission: EM | Admit: 2019-12-27 | Discharge: 2019-12-28 | Disposition: A | Discharge status: Home / Self Care | Payer: Managed Care, Other (non HMO) | Attending: Emergency Medicine | Admitting: Emergency Medicine

## 2019-12-27 DIAGNOSIS — Z5321 Procedure and treatment not carried out due to patient leaving prior to being seen by health care provider: Secondary | ICD-10-CM | POA: Insufficient documentation

## 2019-12-27 DIAGNOSIS — M542 Cervicalgia: Secondary | ICD-10-CM | POA: Diagnosis not present

## 2019-12-27 NOTE — ED Notes (Signed)
Pt states does not want to wait any longer, refusing vitals.

## 2019-12-27 NOTE — ED Triage Notes (Signed)
The pt fell down his steps app[place] one hour ago  No loc c/o neck and lt lower leg pain

## 2019-12-28 NOTE — ED Notes (Signed)
Pt said he was leaving 

## 2019-12-29 ENCOUNTER — Ambulatory Visit
Admission: RE | Admit: 2019-12-29 | Discharge: 2019-12-29 | Disposition: A | Discharge status: Home / Self Care | Payer: Managed Care, Other (non HMO) | Source: Ambulatory Visit | Attending: Physician Assistant | Admitting: Physician Assistant

## 2019-12-29 ENCOUNTER — Other Ambulatory Visit: Payer: Self-pay

## 2019-12-29 VITALS — BP 157/81 | HR 81 | Temp 97.7°F | Resp 16

## 2019-12-29 DIAGNOSIS — M79605 Pain in left leg: Secondary | ICD-10-CM

## 2019-12-29 MED ORDER — DICLOFENAC SODIUM 50 MG PO TBEC: 50.0000 mg | DELAYED_RELEASE_TABLET | Freq: Two times a day (BID) | ORAL | 0 refills | Status: DC

## 2019-12-29 MED ORDER — TIZANIDINE HCL 2 MG PO TABS: 2.0000 mg | ORAL_TABLET | Freq: Three times a day (TID) | ORAL | 0 refills | Status: DC | PRN
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
## 2019-12-29 NOTE — Discharge Instructions (Signed)
No alarming signs on exam. Diclofenac, tizanidine as needed. Follow up with PCP if symptoms not improving.

## 2019-12-29 NOTE — ED Triage Notes (Signed)
Patient is here to with complaints of left lower leg pain from falling down his steps at home on Sunday. Patient states his calf is swollen and painful.

## 2020-02-17 ENCOUNTER — Encounter (HOSPITAL_BASED_OUTPATIENT_CLINIC_OR_DEPARTMENT_OTHER): Payer: Self-pay | Admitting: *Deleted

## 2020-02-17 ENCOUNTER — Other Ambulatory Visit: Payer: Self-pay

## 2020-02-17 ENCOUNTER — Emergency Department (HOSPITAL_BASED_OUTPATIENT_CLINIC_OR_DEPARTMENT_OTHER)
Admission: EM | Admit: 2020-02-17 | Discharge: 2020-02-17 | Disposition: A | Discharge status: Home / Self Care | Payer: Managed Care, Other (non HMO) | Attending: Emergency Medicine | Admitting: Emergency Medicine

## 2020-02-17 ENCOUNTER — Emergency Department (HOSPITAL_BASED_OUTPATIENT_CLINIC_OR_DEPARTMENT_OTHER): Payer: Managed Care, Other (non HMO)

## 2020-02-17 DIAGNOSIS — Y92410 Unspecified street and highway as the place of occurrence of the external cause: Secondary | ICD-10-CM | POA: Diagnosis not present

## 2020-02-17 DIAGNOSIS — M542 Cervicalgia: Secondary | ICD-10-CM | POA: Diagnosis present

## 2020-02-17 DIAGNOSIS — Y9389 Activity, other specified: Secondary | ICD-10-CM | POA: Insufficient documentation

## 2020-02-17 DIAGNOSIS — Y998 Other external cause status: Secondary | ICD-10-CM | POA: Insufficient documentation

## 2020-02-17 DIAGNOSIS — Z79899 Other long term (current) drug therapy: Secondary | ICD-10-CM | POA: Insufficient documentation

## 2020-02-17 DIAGNOSIS — M549 Dorsalgia, unspecified: Secondary | ICD-10-CM | POA: Insufficient documentation
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
## 2020-02-17 NOTE — Discharge Instructions (Signed)
Tylenol or ibuprofen for discomfort. Please refer to attached instructions. Follow-up with your primary care provider if no improvement in symptoms over the next few days.

## 2020-02-24 ENCOUNTER — Other Ambulatory Visit: Payer: Self-pay

## 2020-02-24 ENCOUNTER — Ambulatory Visit (INDEPENDENT_AMBULATORY_CARE_PROVIDER_SITE_OTHER): Payer: Managed Care, Other (non HMO) | Admitting: Physician Assistant

## 2020-02-24 VITALS — BP 132/90 | HR 68 | Temp 97.9°F | Resp 20 | Ht 74.0 in | Wt 233.3 lb

## 2020-02-24 DIAGNOSIS — W3400XD Accidental discharge from unspecified firearms or gun, subsequent encounter: Secondary | ICD-10-CM | POA: Diagnosis not present

## 2020-02-24 DIAGNOSIS — S71132D Puncture wound without foreign body, left thigh, subsequent encounter: Secondary | ICD-10-CM
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
## 2020-02-26 ENCOUNTER — Other Ambulatory Visit: Payer: Self-pay | Admitting: *Deleted

## 2020-02-26 DIAGNOSIS — W3400XA Accidental discharge from unspecified firearms or gun, initial encounter: Secondary | ICD-10-CM

## 2020-03-14 ENCOUNTER — Ambulatory Visit (INDEPENDENT_AMBULATORY_CARE_PROVIDER_SITE_OTHER): Payer: Managed Care, Other (non HMO) | Admitting: Orthopedic Surgery

## 2020-03-14 ENCOUNTER — Encounter: Payer: Self-pay | Admitting: Orthopedic Surgery

## 2020-03-14 ENCOUNTER — Other Ambulatory Visit: Payer: Self-pay

## 2020-03-14 DIAGNOSIS — M545 Low back pain, unspecified: Secondary | ICD-10-CM | POA: Diagnosis not present

## 2020-03-16 ENCOUNTER — Encounter: Payer: Self-pay | Admitting: Orthopedic Surgery
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
## 2020-03-17 ENCOUNTER — Telehealth: Payer: Self-pay

## 2020-03-17 NOTE — Telephone Encounter (Signed)
Patient called in wanting to get his visit summary printed out and have them picked up tomorrow .

## 2020-03-17 NOTE — Telephone Encounter (Signed)
Patient called and needed a copy of his medical records for court. I printed his records and he by and picked them up.   [employer]

## 2020-03-18 NOTE — Telephone Encounter (Signed)
Can you print that off for him?

## 2020-04-15 ENCOUNTER — Ambulatory Visit: Payer: Managed Care, Other (non HMO) | Admitting: Orthopedic Surgery

## 2020-04-18 ENCOUNTER — Ambulatory Visit: Payer: Managed Care, Other (non HMO) | Admitting: Orthopedic Surgery

## 2020-06-17 ENCOUNTER — Emergency Department (HOSPITAL_BASED_OUTPATIENT_CLINIC_OR_DEPARTMENT_OTHER)
Admission: EM | Admit: 2020-06-17 | Discharge: 2020-06-17 | Disposition: A | Discharge status: Home / Self Care | Payer: Managed Care, Other (non HMO) | Attending: Emergency Medicine | Admitting: Emergency Medicine

## 2020-06-17 ENCOUNTER — Other Ambulatory Visit: Payer: Self-pay

## 2020-06-17 ENCOUNTER — Encounter (HOSPITAL_BASED_OUTPATIENT_CLINIC_OR_DEPARTMENT_OTHER): Payer: Self-pay | Admitting: Emergency Medicine

## 2020-06-17 DIAGNOSIS — Z20822 Contact with and (suspected) exposure to covid-19: Secondary | ICD-10-CM

## 2020-06-17 DIAGNOSIS — U071 COVID-19: Secondary | ICD-10-CM | POA: Insufficient documentation

## 2020-06-17 DIAGNOSIS — R509 Fever, unspecified: Secondary | ICD-10-CM | POA: Diagnosis present

## 2020-06-17 LAB — SARS CORONAVIRUS 2 (TAT 6-24 HRS): SARS Coronavirus 2: POSITIVE — AB

## 2020-06-17 NOTE — Discharge Instructions (Addendum)
Please read and follow all provided instructions.  Your diagnoses today include:  1. Suspected COVID-19 virus infection    Tests performed today include:  COVID-19 test - pending  Vital signs. See below for your results today.   Medications prescribed:   None  Take any prescribed medications only as directed. Treatment for your infection is aimed at treating the symptoms. There are no medications, such as antibiotics, that will cure your infection.   Home care instructions:  Follow any educational materials contained in this packet.   Please continue drinking plenty of fluids.  Use over-the-counter medicines as needed as directed on packaging for symptom relief.  You may also use ibuprofen or tylenol as directed on packaging for pain or fever.  Do not take multiple medicines containing Tylenol or acetaminophen to avoid taking too much of this medication.  If you are positive for Covid-19, you should isolate yourself and not be exposed to other people for 5 days after your symptoms began. If you are not feeling better at day 5, you need to isolate yourself for a total of 10 days. If you are feeling better by day 5, you should wear a mask properly, over your nose and mouth, at all times while around other people until 10 days after your symptoms started.    Follow-up instructions: Please follow-up with your primary care provider as needed for further evaluation of your symptoms if you are not feeling better.   Return instructions:   Please return to the Emergency Department if you experience worsening symptoms.   RETURN IMMEDIATELY IF you develop shortness of breath, confusion or altered mental status, a new rash, become dizzy, faint, or poorly responsive, or are unable to be cared for at home.  Please return if you have persistent vomiting and cannot keep down fluids or develop a fever that is not controlled by tylenol or motrin.    Please return if you have any other emergent  concerns.  Additional Information:  Your vital signs today were: BP (!) 131/96 (BP Location: Right Arm)    Pulse 80    Temp 98.3 F (36.8 C) (Oral)    Resp 18    Ht 6\' 2"  (1.88 m)    Wt 105.5 kg    SpO2 99%    BMI 29.86 kg/m  If your blood pressure (BP) was elevated above 135/85 this visit, please have this repeated by your doctor within one month. --------------

## 2020-06-17 NOTE — ED Triage Notes (Signed)
Reports runny nose, body aches, fever, headache, cough, loss of taste and smell since last night.  Taking theraflu at home.
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
## 2020-08-12 ENCOUNTER — Encounter: Payer: Self-pay | Admitting: Vascular Surgery

## 2020-08-12 ENCOUNTER — Encounter (HOSPITAL_COMMUNITY): Payer: Self-pay | Admitting: Vascular Surgery

## 2020-10-08 ENCOUNTER — Other Ambulatory Visit: Payer: Self-pay

## 2020-10-08 ENCOUNTER — Ambulatory Visit
Admission: EM | Admit: 2020-10-08 | Discharge: 2020-10-08 | Disposition: A | Discharge status: Home / Self Care | Payer: Managed Care, Other (non HMO) | Attending: Emergency Medicine | Admitting: Emergency Medicine

## 2020-10-08 ENCOUNTER — Encounter: Payer: Self-pay | Admitting: Emergency Medicine

## 2020-10-08 DIAGNOSIS — M79605 Pain in left leg: Secondary | ICD-10-CM | POA: Diagnosis not present

## 2020-10-08 HISTORY — DX: Accidental discharge from unspecified firearms or gun, initial encounter: W34.00XA

## 2020-10-08 MED ORDER — TIZANIDINE HCL 2 MG PO TABS: 2.0000 mg | ORAL_TABLET | Freq: Four times a day (QID) | ORAL | 0 refills | Status: DC | PRN

## 2020-10-08 MED ORDER — NAPROXEN 500 MG PO TABS: 500.0000 mg | ORAL_TABLET | Freq: Two times a day (BID) | ORAL | 0 refills | Status: DC

## 2020-10-08 NOTE — Discharge Instructions (Addendum)
Ice and elevate leg Ace wrap as needed for comfort Naprosyn twice daily with food or may use diclofenac which you have at home Tizanidine at home/bedtime-this may cause drowsiness, do not take
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
## 2020-10-20 ENCOUNTER — Ambulatory Visit: Payer: Managed Care, Other (non HMO) | Attending: Emergency Medicine | Admitting: Physical Therapy
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
## 2020-11-14 ENCOUNTER — Encounter (HOSPITAL_COMMUNITY): Payer: Self-pay | Admitting: Vascular Surgery

## 2020-12-09 ENCOUNTER — Encounter: Payer: Self-pay | Admitting: Emergency Medicine

## 2020-12-09 ENCOUNTER — Emergency Department (HOSPITAL_BASED_OUTPATIENT_CLINIC_OR_DEPARTMENT_OTHER)
Admission: EM | Admit: 2020-12-09 | Discharge: 2020-12-09 | Disposition: A | Discharge status: Home / Self Care | Payer: Managed Care, Other (non HMO) | Attending: Emergency Medicine | Admitting: Emergency Medicine

## 2020-12-09 ENCOUNTER — Other Ambulatory Visit: Payer: Self-pay

## 2020-12-09 ENCOUNTER — Encounter (HOSPITAL_BASED_OUTPATIENT_CLINIC_OR_DEPARTMENT_OTHER): Payer: Self-pay | Admitting: Emergency Medicine

## 2020-12-09 ENCOUNTER — Emergency Department (HOSPITAL_BASED_OUTPATIENT_CLINIC_OR_DEPARTMENT_OTHER): Payer: Managed Care, Other (non HMO)

## 2020-12-09 DIAGNOSIS — S92425A Nondisplaced fracture of distal phalanx of left great toe, initial encounter for closed fracture: Secondary | ICD-10-CM

## 2020-12-09 DIAGNOSIS — S80212A Abrasion, left knee, initial encounter: Secondary | ICD-10-CM | POA: Insufficient documentation

## 2020-12-09 DIAGNOSIS — S99922A Unspecified injury of left foot, initial encounter: Secondary | ICD-10-CM | POA: Diagnosis present

## 2020-12-09 HISTORY — DX: Other chronic pain: G89.29

## 2020-12-09 NOTE — Discharge Instructions (Addendum)
Wear a comfortable hard soled shoe.  Healing should take place in the next 3 to 4 weeks.  Take ibuprofen 600 mg every 6 hours as needed for pain.
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
## 2020-12-13 ENCOUNTER — Ambulatory Visit
Admission: EM | Admit: 2020-12-13 | Discharge: 2020-12-13 | Disposition: A | Discharge status: Home / Self Care | Payer: Managed Care, Other (non HMO) | Attending: Emergency Medicine | Admitting: Emergency Medicine

## 2020-12-13 ENCOUNTER — Other Ambulatory Visit: Payer: Self-pay

## 2020-12-13 DIAGNOSIS — S92425A Nondisplaced fracture of distal phalanx of left great toe, initial encounter for closed fracture: Secondary | ICD-10-CM

## 2020-12-13 MED ORDER — IBUPROFEN 800 MG PO TABS: 800.0000 mg | ORAL_TABLET | Freq: Three times a day (TID) | ORAL | 0 refills | Status: DC

## 2020-12-13 MED ORDER — HYDROCODONE-ACETAMINOPHEN 5-325 MG PO TABS: 1.0000 | ORAL_TABLET | Freq: Four times a day (QID) | ORAL | 0 refills | Status: AC | PRN
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
## 2020-12-13 NOTE — Discharge Instructions (Addendum)
Crutches and shoe for comfort as you tolerate Tylenol and ibuprofen for mild to moderate pain Use hydrocodone for severe pain, do not drive or work after taking, use sparingly, will cause drowsiness Ice and elevate foot Follow-up with sports medicine to ensure healing Please return for any concerns
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
## 2021-07-26 NOTE — Telephone Encounter (Signed)
This encounter was created in error - please disregard.

## 2022-05-23 ENCOUNTER — Other Ambulatory Visit: Payer: Self-pay

## 2022-05-23 ENCOUNTER — Emergency Department (HOSPITAL_BASED_OUTPATIENT_CLINIC_OR_DEPARTMENT_OTHER)
Admission: EM | Admit: 2022-05-23 | Discharge: 2022-05-23 | Disposition: A | Discharge status: Home / Self Care | Payer: BC Managed Care – PPO | Attending: Emergency Medicine | Admitting: Emergency Medicine

## 2022-05-23 DIAGNOSIS — Z7901 Long term (current) use of anticoagulants: Secondary | ICD-10-CM | POA: Diagnosis not present

## 2022-05-23 DIAGNOSIS — Z1152 Encounter for screening for COVID-19: Secondary | ICD-10-CM | POA: Diagnosis not present

## 2022-05-23 DIAGNOSIS — B974 Respiratory syncytial virus as the cause of diseases classified elsewhere: Secondary | ICD-10-CM | POA: Insufficient documentation

## 2022-05-23 DIAGNOSIS — J029 Acute pharyngitis, unspecified: Secondary | ICD-10-CM | POA: Diagnosis not present

## 2022-05-23 DIAGNOSIS — B9789 Other viral agents as the cause of diseases classified elsewhere: Secondary | ICD-10-CM | POA: Diagnosis not present

## 2022-05-23 DIAGNOSIS — J069 Acute upper respiratory infection, unspecified: Secondary | ICD-10-CM | POA: Insufficient documentation

## 2022-05-23 LAB — GROUP A STREP BY PCR: Group A Strep by PCR: NOT DETECTED

## 2022-05-23 LAB — RESP PANEL BY RT-PCR (RSV, FLU A&B, COVID)  RVPGX2
Influenza A by PCR: NEGATIVE
Influenza B by PCR: NEGATIVE
Resp Syncytial Virus by PCR: POSITIVE — AB
SARS Coronavirus 2 by RT PCR: NEGATIVE

## 2022-05-23 NOTE — ED Provider Notes (Signed)
[facility] EMERGENCY DEPARTMENT Provider Note   CSN: [id number] Arrival date & time: 05/23/22  5400     History  Chief Complaint  Patient presents with   Sore Throat    Tyler Rowland is a 46 y.o. male.  He is here with complaint of sore throat cough headache runny nose that started yesterday.  He tried some over-the-counter TheraFlu without improvement.  He does not think he has had any fevers.  No sick contacts or recent travel.  The history is provided by the patient.  Sore Throat This is a new problem. The current episode started yesterday. The problem occurs constantly. The problem has not changed since onset.Associated symptoms include headaches. Pertinent negatives include no chest pain, no abdominal pain and no shortness of breath. The symptoms are aggravated by swallowing. Nothing relieves the symptoms. He has tried acetaminophen for the symptoms. The treatment provided no relief.       Home Medications Prior to Admission medications   Medication Sig Start Date End Date Taking? Authorizing Provider  ibuprofen (ADVIL) 800 MG tablet Take 1 tablet (800 mg total) by mouth 3 (three) times daily. 12/13/20   Wieters, Hallie C, PA-C  Rivaroxaban 15 & 20 MG TBPK Take as directed: Start with one 15mg  tablet by mouth twice a day with food. On Day 22, switch to one 20mg  tablet once a day with food. Patient not taking: Reported on 04/20/2019 02/11/19 04/26/19  04/13/19, MD      Allergies    Patient has no known allergies.    Review of Systems   Review of Systems  Constitutional:  Negative for fever.  HENT:  Positive for rhinorrhea, sinus pressure, sneezing and sore throat.   Eyes:  Negative for visual disturbance.  Respiratory:  Negative for shortness of breath.   Cardiovascular:  Negative for chest pain.  Gastrointestinal:  Negative for abdominal pain.  Neurological:  Positive for headaches.    Physical Exam Updated Vital Signs BP (!) 142/99   Pulse  66   Temp 97.7 F (36.5 C) (Oral)   Resp 18   Ht 6\' 2"  (1.88 m)   Wt 106.6 kg   SpO2 96%   BMI 30.17 kg/m  Physical Exam Vitals and nursing note reviewed.  Constitutional:      General: He is not in acute distress.    Appearance: He is well-developed.  HENT:     Head: Normocephalic and atraumatic.     Right Ear: Tympanic membrane normal.     Left Ear: Tympanic membrane normal.     Nose: Rhinorrhea present.     Mouth/Throat:     Mouth: Mucous membranes are moist.     Pharynx: Uvula midline. Posterior oropharyngeal erythema present. No oropharyngeal exudate.  Eyes:     Conjunctiva/sclera: Conjunctivae normal.  Cardiovascular:     Rate and Rhythm: Normal rate and regular rhythm.     Heart sounds: No murmur heard. Pulmonary:     Effort: Pulmonary effort is normal. No respiratory distress.     Breath sounds: Normal breath sounds.  Abdominal:     Palpations: Abdomen is soft.     Tenderness: There is no abdominal tenderness.  Musculoskeletal:        General: No swelling.     Cervical back: Neck supple.  Skin:    General: Skin is warm and dry.     Capillary Refill: Capillary refill takes less than 2 seconds.  Neurological:     General: No focal  deficit present.     Mental Status: He is alert.     ED Results / Procedures / Treatments   Labs (all labs ordered are listed, but only abnormal results are displayed) Labs Reviewed  RESP PANEL BY RT-PCR (RSV, FLU A&B, COVID)  RVPGX2 - Abnormal; Notable for the following components:      Result Value   Resp Syncytial Virus by PCR POSITIVE (*)    All other components within normal limits  GROUP A STREP BY PCR    EKG None  Radiology No results found.  Procedures Procedures    Medications Ordered in ED Medications - No data to display  ED Course/ Medical Decision Making/ A&P                           Medical Decision Making  This patient complains of sore throat headache runny nose; this involves an extensive  number of treatment Options and is a complaint that carries with it a high risk of complications and morbidity. The differential includes COVID, flu, viral syndrome, strep, RSV  I ordered, reviewed and interpreted labs, which included RSV positive Previous records obtained and reviewed in epic no recent admissions Social determinants considered, no significant barriers Critical Interventions: None  After the interventions stated above, I reevaluated the patient and found patient to be hemodynamically stable in no distress Admission and further testing considered, no indications for admission or further workup at this time.  Discussed symptomatic treatment and return instructions.         Final Clinical Impression(s) / ED Diagnoses Final diagnoses:  Viral URI with cough    Rx / DC Orders ED Discharge Orders     None         Terrilee Files, MD 05/23/22 1734

## 2022-05-23 NOTE — ED Triage Notes (Signed)
Pt presents with c/o throat pain, headache, congestion, and runny nose since yesterday. Pt states that he tied Theraflu and NyQuil at home.

## 2022-05-23 NOTE — Discharge Instructions (Signed)
You were seen in the emergency department for cold symptoms.  Your strep test was negative but you tested for RSV which is a virus.  Please continue Tylenol and ibuprofen, drink plenty of fluids.  Return to the emergency department if any worsening or concerning symptoms

## 2022-07-04 ENCOUNTER — Emergency Department (HOSPITAL_BASED_OUTPATIENT_CLINIC_OR_DEPARTMENT_OTHER)
Admission: EM | Admit: 2022-07-04 | Discharge: 2022-07-04 | Disposition: A | Discharge status: Home / Self Care | Payer: BC Managed Care – PPO | Attending: Emergency Medicine | Admitting: Emergency Medicine

## 2022-07-04 DIAGNOSIS — R0981 Nasal congestion: Secondary | ICD-10-CM

## 2022-07-04 DIAGNOSIS — R067 Sneezing: Secondary | ICD-10-CM | POA: Diagnosis not present

## 2022-07-04 DIAGNOSIS — R519 Headache, unspecified: Secondary | ICD-10-CM | POA: Insufficient documentation

## 2022-07-04 DIAGNOSIS — Z7901 Long term (current) use of anticoagulants: Secondary | ICD-10-CM | POA: Insufficient documentation

## 2022-07-04 DIAGNOSIS — Z20822 Contact with and (suspected) exposure to covid-19: Secondary | ICD-10-CM | POA: Diagnosis not present

## 2022-07-04 DIAGNOSIS — R059 Cough, unspecified: Secondary | ICD-10-CM | POA: Diagnosis not present

## 2022-07-04 LAB — RESP PANEL BY RT-PCR (RSV, FLU A&B, COVID)  RVPGX2
Influenza A by PCR: NEGATIVE
Influenza B by PCR: NEGATIVE
Resp Syncytial Virus by PCR: NEGATIVE
SARS Coronavirus 2 by RT PCR: NEGATIVE

## 2022-07-04 NOTE — Discharge Instructions (Addendum)
Evaluation for your cough, nasal congestion and sneezing and headache is overall reassuring.  Your vitals are stable and you are negative for flu COVID and RSV.  Recommend conservative treatment at home.  This includes rest and good hydration.  He also take Tylenol and ibuprofen for symptomatic relief and fever control.  If you have new chest pain or shortness of breath or any other concerning symptom please return to emergency department for further evaluation.

## 2022-07-04 NOTE — ED Provider Notes (Signed)
[facility] Provider Note   CSN: [id number] Arrival date & time: 07/04/22  8756     History  Chief Complaint  Patient presents with   URI   HPI Tyler Rowland is a 47 y.o. male presenting for cough, nasal congestion, sneezing, and a mild headache.  Symptoms started 2 days ago.  States he works in a [employer] and there may have been a few coworkers with similar symptoms.  Denies chest pain shortness of breath.  Took TheraFlu yesterday which helped some.  Cough is dry.  Denies fever.  States he was diagnosed with RSV a month ago and is concerned he might have it again.   URI Presenting symptoms: congestion and cough        Home Medications Prior to Admission medications   Medication Sig Start Date End Date Taking? Authorizing Provider  ibuprofen (ADVIL) 800 MG tablet Take 1 tablet (800 mg total) by mouth 3 (three) times daily. 12/13/20   Wieters, Hallie C, PA-C  Rivaroxaban 15 & 20 MG TBPK Take as directed: Start with one 15mg  tablet by mouth twice a day with food. On Day 22, switch to one 20mg  tablet once a day with food. Patient not taking: Reported on 04/20/2019 02/11/19 04/26/19  Angelia Mould, MD      Allergies    Patient has no known allergies.    Review of Systems   Review of Systems  HENT:  Positive for congestion.   Respiratory:  Positive for cough.     Physical Exam   Vitals:   07/04/22 0745 07/04/22 0746  BP: 120/74   Pulse: 69   Resp: 18 18  Temp:  98.1 F (36.7 C)  SpO2: 97%     CONSTITUTIONAL:  well-appearing, NAD NEURO:  Alert and oriented x 3, CN 3-12 grossly intact EYES:  eyes equal and reactive ENT/NECK:  Supple, no stridor  CARDIO:  regular rate and rhythm, appears well-perfused  PULM:  No respiratory distress, CTAB MSK/SPINE:  No gross deformities, no edema, moves all extremities  SKIN:  no rash, atraumatic   *Additional and/or pertinent findings included in MDM below   ED Results /  Procedures / Treatments   Labs (all labs ordered are listed, but only abnormal results are displayed) Labs Reviewed  RESP PANEL BY RT-PCR (RSV, FLU A&B, COVID)  RVPGX2    EKG None  Radiology No results found.  Procedures Procedures    Medications Ordered in ED Medications - No data to display  ED Course/ Medical Decision Making/ A&P                             Medical Decision Making  47 year old male who is well-appearing and hemodynamically stable presenting for URI symptoms started 2 days ago.  Physical exam was unremarkable.  Differential diagnosis for this complaint includes RSV, COVID and flu or other URI, and pneumonia.  Respiratory panel was negative for COVID flu and RSV.  Considered pneumonia but unlikely given patient is not short of breath has not a fever at home.  There are consistent with likely ongoing viral URI.  Offered cough suppressant but patient declined stating the cough is only mild.  Also offered ibuprofen but patient stated he could take some when he gets home.  Discussed return precautions.  Vitals have been stable for this encounter.  Discharged home.        Final Clinical Impression(s) /  ED Diagnoses Final diagnoses:  Cough, unspecified type  Nasal congestion    Rx / DC Orders ED Discharge Orders     None         Harriet Pho, PA-C 07/04/22 0930    Horton, Alvin Critchley, DO 07/04/22 1125

## 2022-07-04 NOTE — ED Triage Notes (Signed)
C/o cough, runny nose, sneezing, headache. States not taking any otc meds

## 2022-08-24 ENCOUNTER — Encounter: Payer: Self-pay | Admitting: Emergency Medicine

## 2022-08-24 ENCOUNTER — Ambulatory Visit
Admission: EM | Admit: 2022-08-24 | Discharge: 2022-08-24 | Disposition: A | Discharge status: Home / Self Care | Payer: BC Managed Care – PPO | Attending: Family Medicine | Admitting: Family Medicine

## 2022-08-24 DIAGNOSIS — K529 Noninfective gastroenteritis and colitis, unspecified: Secondary | ICD-10-CM | POA: Diagnosis not present

## 2022-08-24 LAB — POCT INFLUENZA A/B
Influenza A, POC: NEGATIVE
Influenza B, POC: NEGATIVE

## 2022-08-24 NOTE — Discharge Instructions (Signed)
Your flu test was negative.  Take Tylenol as needed for aches or fever.  Take Imodium/loperamide over-the-counter as needed for diarrhea
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
## 2022-08-24 NOTE — ED Triage Notes (Signed)
Reports chills, diarrhea, generalized body ache, headache starting Wednesday night around 6 pm. Reports generalized abdominal cramping initially that has resolved.  Denies cough, sore throat, sick exposure, nasal congestion. Only taking Nyquil at home to allow him to sleep.

## 2022-09-13 ENCOUNTER — Encounter (HOSPITAL_BASED_OUTPATIENT_CLINIC_OR_DEPARTMENT_OTHER): Payer: Self-pay

## 2022-09-13 ENCOUNTER — Emergency Department (HOSPITAL_BASED_OUTPATIENT_CLINIC_OR_DEPARTMENT_OTHER)
Admission: EM | Admit: 2022-09-13 | Discharge: 2022-09-13 | Disposition: A | Discharge status: Home / Self Care | Payer: BC Managed Care – PPO | Attending: Emergency Medicine | Admitting: Emergency Medicine

## 2022-09-13 ENCOUNTER — Other Ambulatory Visit: Payer: Self-pay

## 2022-09-13 ENCOUNTER — Emergency Department (HOSPITAL_BASED_OUTPATIENT_CLINIC_OR_DEPARTMENT_OTHER): Payer: BC Managed Care – PPO

## 2022-09-13 DIAGNOSIS — M7989 Other specified soft tissue disorders: Secondary | ICD-10-CM | POA: Insufficient documentation

## 2022-09-13 DIAGNOSIS — M79652 Pain in left thigh: Secondary | ICD-10-CM | POA: Diagnosis not present

## 2022-09-13 DIAGNOSIS — Z7901 Long term (current) use of anticoagulants: Secondary | ICD-10-CM | POA: Diagnosis not present

## 2022-09-13 MED ORDER — IBUPROFEN 400 MG PO TABS
600.0000 mg | ORAL_TABLET | Freq: Once | ORAL | Status: AC
  Administered 2022-09-13: 600 mg via ORAL
  Filled 2022-09-13: qty 1
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
## 2022-09-13 NOTE — ED Notes (Signed)
Ultrasound at bedside

## 2022-09-13 NOTE — Discharge Instructions (Signed)
Use Tylenol every 4 hours and ibuprofen every 6 as needed for pain.  Work note provided.  Your ultrasound did not show blood clot.  Return for new concerns.
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
## 2022-10-27 ENCOUNTER — Encounter (HOSPITAL_BASED_OUTPATIENT_CLINIC_OR_DEPARTMENT_OTHER): Payer: Self-pay | Admitting: Emergency Medicine

## 2022-10-27 ENCOUNTER — Emergency Department (HOSPITAL_BASED_OUTPATIENT_CLINIC_OR_DEPARTMENT_OTHER)
Admission: EM | Admit: 2022-10-27 | Discharge: 2022-10-27 | Disposition: A | Discharge status: Home / Self Care | Payer: BC Managed Care – PPO | Attending: Emergency Medicine | Admitting: Emergency Medicine

## 2022-10-27 ENCOUNTER — Other Ambulatory Visit: Payer: Self-pay

## 2022-10-27 DIAGNOSIS — Z7901 Long term (current) use of anticoagulants: Secondary | ICD-10-CM | POA: Insufficient documentation

## 2022-10-27 DIAGNOSIS — K029 Dental caries, unspecified: Secondary | ICD-10-CM

## 2022-10-27 DIAGNOSIS — K0889 Other specified disorders of teeth and supporting structures: Secondary | ICD-10-CM | POA: Diagnosis not present

## 2022-10-27 MED ORDER — IBUPROFEN 800 MG PO TABS: 800.0000 mg | ORAL_TABLET | Freq: Four times a day (QID) | ORAL | 0 refills | Status: DC | PRN

## 2022-10-27 MED ORDER — AMOXICILLIN 500 MG PO CAPS
500.0000 mg | ORAL_CAPSULE | Freq: Once | ORAL | Status: AC
  Administered 2022-10-27: 500 mg via ORAL
  Filled 2022-10-27: qty 1

## 2022-10-27 MED ORDER — AMOXICILLIN 500 MG PO CAPS: 500.0000 mg | ORAL_CAPSULE | Freq: Three times a day (TID) | ORAL | 0 refills | Status: DC

## 2022-10-27 MED ORDER — HYDROCODONE-ACETAMINOPHEN 5-325 MG PO TABS: 1.0000 | ORAL_TABLET | ORAL | 0 refills | Status: DC | PRN

## 2022-10-27 NOTE — ED Triage Notes (Signed)
Pt reports R lower dental pain since yesterday. Working on Chief Executive Officer appointment.
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
## 2022-10-31 ENCOUNTER — Ambulatory Visit: Payer: BC Managed Care – PPO | Admitting: Nurse Practitioner

## 2022-10-31 ENCOUNTER — Encounter: Payer: Self-pay | Admitting: Nurse Practitioner

## 2022-10-31 ENCOUNTER — Other Ambulatory Visit (HOSPITAL_COMMUNITY)
Admission: RE | Admit: 2022-10-31 | Discharge: 2022-10-31 | Disposition: A | Discharge status: Home / Self Care | Payer: BC Managed Care – PPO | Source: Ambulatory Visit | Attending: Nurse Practitioner | Admitting: Nurse Practitioner

## 2022-10-31 VITALS — BP 120/90 | HR 96 | Temp 98.4°F | Ht 74.0 in | Wt 247.2 lb

## 2022-10-31 DIAGNOSIS — Z113 Encounter for screening for infections with a predominantly sexual mode of transmission: Secondary | ICD-10-CM

## 2022-10-31 DIAGNOSIS — Z6831 Body mass index (BMI) 31.0-31.9, adult: Secondary | ICD-10-CM | POA: Diagnosis not present

## 2022-10-31 DIAGNOSIS — Z7689 Persons encountering health services in other specified circumstances: Secondary | ICD-10-CM

## 2022-10-31 DIAGNOSIS — M79605 Pain in left leg: Secondary | ICD-10-CM | POA: Diagnosis not present

## 2022-10-31 DIAGNOSIS — G8929 Other chronic pain: Secondary | ICD-10-CM | POA: Diagnosis not present

## 2022-10-31 DIAGNOSIS — Z13228 Encounter for screening for other metabolic disorders: Secondary | ICD-10-CM

## 2022-10-31 DIAGNOSIS — Z1159 Encounter for screening for other viral diseases: Secondary | ICD-10-CM

## 2022-10-31 DIAGNOSIS — E6609 Other obesity due to excess calories: Secondary | ICD-10-CM | POA: Diagnosis not present

## 2022-10-31 DIAGNOSIS — Z1211 Encounter for screening for malignant neoplasm of colon: Secondary | ICD-10-CM

## 2022-10-31 DIAGNOSIS — Z114 Encounter for screening for human immunodeficiency virus [HIV]: Secondary | ICD-10-CM
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
## 2022-10-31 NOTE — Patient Instructions (Addendum)
You will get a call from [facility] for an MRI of your thigh.   I have also placed a referral for GI for a colonoscopy.

## 2022-11-01 LAB — RPR, QUANT+TP ABS (REFLEX)
Rapid Plasma Reagin, Quant: 1:32 {titer} — ABNORMAL HIGH
T Pallidum Abs: REACTIVE — AB

## 2022-11-01 LAB — HEPATITIS C ANTIBODY: Hep C Virus Ab: NONREACTIVE

## 2022-11-01 LAB — BASIC METABOLIC PANEL
BUN/Creatinine Ratio: 7 — ABNORMAL LOW (ref 9–20)
BUN: 8 mg/dL (ref 6–24)
CO2: 22 mmol/L (ref 20–29)
Calcium: 9.6 mg/dL (ref 8.7–10.2)
Chloride: 104 mmol/L (ref 96–106)
Creatinine, Ser: 1.21 mg/dL (ref 0.76–1.27)
Glucose: 103 mg/dL — ABNORMAL HIGH (ref 70–99)
Potassium: 4.4 mmol/L (ref 3.5–5.2)
Sodium: 140 mmol/L (ref 134–144)
eGFR: 74 mL/min/{1.73_m2} (ref >59)

## 2022-11-01 LAB — HIV ANTIBODY (ROUTINE TESTING W REFLEX): HIV Screen 4th Generation wRfx: NONREACTIVE

## 2022-11-01 LAB — HEMOGLOBIN A1C
Est. average glucose Bld gHb Est-mCnc: 146 mg/dL
Hgb A1c MFr Bld: 6.7 % — ABNORMAL HIGH (ref 4.8–5.6)

## 2022-11-01 LAB — HSV 1 AND 2 AB, IGG
HSV 1 Glycoprotein G Ab, IgG: 36.8 index — ABNORMAL HIGH (ref 0.00–0.90)
HSV 2 IgG, Type Spec: <0.91 index (ref 0.00–0.90)

## 2022-11-01 LAB — RPR: RPR Ser Ql: REACTIVE — AB

## 2022-11-01 LAB — HEPATITIS B SURFACE ANTIGEN: Hepatitis B Surface Ag: NEGATIVE

## 2022-11-02 LAB — URINE CYTOLOGY ANCILLARY ONLY
Bacterial Vaginitis-Urine: NEGATIVE
Candida Urine: NEGATIVE
Chlamydia: NEGATIVE
Comment: NEGATIVE
Comment: NEGATIVE
Comment: NORMAL
Neisseria Gonorrhea: NEGATIVE
Trichomonas: NEGATIVE

## 2022-11-14 ENCOUNTER — Encounter: Payer: Self-pay | Admitting: Nurse Practitioner

## 2022-11-22 ENCOUNTER — Other Ambulatory Visit: Payer: Self-pay | Admitting: Nurse Practitioner

## 2022-11-22 DIAGNOSIS — G8929 Other chronic pain: Secondary | ICD-10-CM

## 2022-11-25 ENCOUNTER — Encounter (HOSPITAL_BASED_OUTPATIENT_CLINIC_OR_DEPARTMENT_OTHER): Payer: Self-pay

## 2022-11-25 ENCOUNTER — Other Ambulatory Visit: Payer: Self-pay

## 2022-11-25 ENCOUNTER — Emergency Department (HOSPITAL_BASED_OUTPATIENT_CLINIC_OR_DEPARTMENT_OTHER)
Admission: EM | Admit: 2022-11-25 | Discharge: 2022-11-25 | Disposition: A | Discharge status: Home / Self Care | Payer: BC Managed Care – PPO | Attending: Emergency Medicine | Admitting: Emergency Medicine

## 2022-11-25 DIAGNOSIS — K0889 Other specified disorders of teeth and supporting structures: Secondary | ICD-10-CM | POA: Diagnosis not present

## 2022-11-25 MED ORDER — AMOXICILLIN 500 MG PO CAPS: 500.0000 mg | ORAL_CAPSULE | Freq: Three times a day (TID) | ORAL | 0 refills | Status: DC

## 2022-11-25 NOTE — ED Triage Notes (Signed)
Pt with lower gum line pain. No obvious abscess. No fevers.

## 2022-11-25 NOTE — ED Provider Notes (Signed)
[facility] Provider Note   CSN: [id number] Arrival date & time: 11/25/22  2003     History  Chief Complaint  Patient presents with   Dental Pain    Tyler Rowland is a 47 y.o. male.  Patient here right lower dental pain.  No significant medical history.  Nothing makes it worse or better.  Denies any difficulty eating or drinking.  No drooling.  No fever or chills.  The history is provided by the patient.       Home Medications Prior to Admission medications   Medication Sig Start Date End Date Taking? Authorizing Provider  amoxicillin (AMOXIL) 500 MG capsule Take 1 capsule (500 mg total) by mouth 3 (three) times daily. 11/25/22  Yes Stephan Nelis, DO  HYDROcodone-acetaminophen (NORCO/VICODIN) 5-325 MG tablet Take 1 tablet by mouth every 4 (four) hours as needed. Patient not taking: Reported on 10/31/2022 10/27/22   Jacalyn Lefevre, MD  ibuprofen (ADVIL) 800 MG tablet Take 1 tablet (800 mg total) by mouth every 6 (six) hours as needed. Patient not taking: Reported on 10/31/2022 10/27/22   Jacalyn Lefevre, MD  Rivaroxaban 15 & 20 MG TBPK Take as directed: Start with one 15mg  tablet by mouth twice a day with food. On Day 22, switch to one 20mg  tablet once a day with food. Patient not taking: Reported on 04/20/2019 02/11/19 04/26/19  Chuck Hint, MD      Allergies    Patient has no known allergies.    Review of Systems   Review of Systems  Physical Exam Updated Vital Signs BP 130/85 (BP Location: Left Arm)   Pulse 73   Temp 98.3 F (36.8 C)   Resp 16   Ht 6\' 2"  (1.88 m)   Wt 108.9 kg   SpO2 98%   BMI 30.81 kg/m  Physical Exam HENT:     Head: Normocephalic.     Nose: Nose normal.     Mouth/Throat:     Mouth: Mucous membranes are moist.     Comments: Dental carry on the right lower teeth no obvious abscess, no trismus or drooling Eyes:     Pupils: Pupils are equal, round, and reactive to light.   Musculoskeletal:     Cervical back: Normal range of motion and neck supple. No tenderness.  Neurological:     Mental Status: He is alert.     ED Results / Procedures / Treatments   Labs (all labs ordered are listed, but only abnormal results are displayed) Labs Reviewed - No data to display  EKG None  Radiology No results found.  Procedures Procedures    Medications Ordered in ED Medications - No data to display  ED Course/ Medical Decision Making/ A&P                             Medical Decision Making Risk Prescription drug management.   Tyler Rowland Is here with right lower dental pain.  Dental carry.  Will prescribe antibiotics and have him follow-up with dentistry.  Have no concern for deep space infectious process.  No trismus or drooling.  Discharged in good condition.  This chart was dictated using voice recognition software.  Despite best efforts to proofread,  errors can occur which can change the documentation meaning.         Final Clinical Impression(s) / ED Diagnoses Final diagnoses:  Pain, dental    Rx /  DC Orders ED Discharge Orders          Ordered    amoxicillin (AMOXIL) 500 MG capsule  3 times daily        11/25/22 2028              Virgina Norfolk, DO 11/25/22 2029

## 2022-11-25 NOTE — Discharge Instructions (Signed)
Recommend 1000 mg of Tylenol every 6 hours as needed for pain.  Recommend 800 mg ibuprofen every 8 hours as needed for pain.  Take first dose antibiotic tonight.  Ultimately I think antibiotics will help with your discomfort.  Follow-up with Dr. Lucky Cowboy with dentistry.

## 2022-11-26 ENCOUNTER — Telehealth: Payer: Self-pay

## 2022-11-26 NOTE — Transitions of Care (Post Inpatient/ED Visit) (Signed)
   11/26/2022  Name: Tyler Rowland MRN: [id number] DOB: 10-27-1975  Today's TOC FU Call Status: Today's TOC FU Call Status:: Unsuccessul Call (1st Attempt) Unsuccessful Call (1st Attempt) Date: 11/26/22  Attempted to reach the patient regarding the most recent Inpatient/ED visit.  Follow Up Plan: Additional outreach attempts will be made to reach the patient to complete the Transitions of Care (Post Inpatient/ED visit) call.   Signature YL,RMA

## 2022-11-29 ENCOUNTER — Encounter: Payer: Self-pay | Admitting: Internal Medicine

## 2022-12-08 ENCOUNTER — Other Ambulatory Visit: Payer: Self-pay

## 2022-12-08 ENCOUNTER — Encounter (HOSPITAL_BASED_OUTPATIENT_CLINIC_OR_DEPARTMENT_OTHER): Payer: Self-pay

## 2022-12-08 ENCOUNTER — Emergency Department (HOSPITAL_BASED_OUTPATIENT_CLINIC_OR_DEPARTMENT_OTHER)
Admission: EM | Admit: 2022-12-08 | Discharge: 2022-12-08 | Disposition: A | Discharge status: Home / Self Care | Payer: BC Managed Care – PPO | Attending: Emergency Medicine | Admitting: Emergency Medicine

## 2022-12-08 ENCOUNTER — Emergency Department (HOSPITAL_BASED_OUTPATIENT_CLINIC_OR_DEPARTMENT_OTHER): Payer: BC Managed Care – PPO

## 2022-12-08 DIAGNOSIS — S93402A Sprain of unspecified ligament of left ankle, initial encounter: Secondary | ICD-10-CM | POA: Diagnosis not present

## 2022-12-08 DIAGNOSIS — Y9367 Activity, basketball: Secondary | ICD-10-CM | POA: Diagnosis not present

## 2022-12-08 DIAGNOSIS — S99912A Unspecified injury of left ankle, initial encounter: Secondary | ICD-10-CM | POA: Diagnosis not present

## 2022-12-08 DIAGNOSIS — S93492A Sprain of other ligament of left ankle, initial encounter: Secondary | ICD-10-CM | POA: Diagnosis not present

## 2022-12-08 DIAGNOSIS — X501XXA Overexertion from prolonged static or awkward postures, initial encounter: Secondary | ICD-10-CM | POA: Insufficient documentation

## 2022-12-08 NOTE — ED Triage Notes (Signed)
Pt was playing basketball today and fell landing on his left ankle and right forearm pain.

## 2022-12-08 NOTE — Discharge Instructions (Addendum)
Please read and follow all provided instructions.  Your diagnoses today include:  1. Sprain of left ankle, unspecified ligament, initial encounter    Tests performed today include: An x-ray of your ankle - does NOT show any broken bones Vital signs. See below for your results today.   Medications prescribed:  None  Take any prescribed medications only as directed.  Home care instructions:  Follow any educational materials contained in this packet Follow R.I.C.E. Protocol: R - rest your injury  I  - use ice on injury without applying directly to skin C - compress injury with bandage or splint E - elevate the injury as much as possible  Follow-up instructions: Please follow-up with your primary care provider or the provided orthopedic (bone specialist) if you continue to have significant pain or trouble walking in 1 week. In this case you may have a severe sprain that requires further care.   Return instructions:  Please return if your toes are numb or tingling, appear gray or blue, or you have severe pain (also elevate leg and loosen splint or wrap) Please return to the Emergency Department if you experience worsening symptoms.  Please return if you have any other emergent concerns.  Additional Information:  Your vital signs today were: BP 138/88 (BP Location: Right Arm)   Pulse 74   Temp 97.9 F (36.6 C) (Oral)   Resp 18   SpO2 97%  If your blood pressure (BP) was elevated above 135/85 this visit, please have this repeated by your doctor within one month. -------------- Your caregiver has diagnosed you as suffering from an ankle sprain. Ankle sprain occurs when the ligaments that hold the ankle joint together are stretched or torn. It may take 4 to 6 weeks to heal.  For Activity: If prescribed crutches, use crutches with non-weight bearing for the first few days. Then, you may walk on your ankle as the pain allows, or as instructed. Start gradually with weight bearing on  the affected ankle. Once you can walk pain free, then try jogging. When you can run forwards, then you can try moving side-to-side. If you cannot walk without crutches in one week, you need a re-check. --------------
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
## 2022-12-11 ENCOUNTER — Other Ambulatory Visit (INDEPENDENT_AMBULATORY_CARE_PROVIDER_SITE_OTHER): Payer: BC Managed Care – PPO

## 2022-12-11 ENCOUNTER — Other Ambulatory Visit: Payer: BC Managed Care – PPO

## 2022-12-11 ENCOUNTER — Ambulatory Visit: Payer: BC Managed Care – PPO | Admitting: Orthopaedic Surgery

## 2022-12-11 DIAGNOSIS — W3400XA Accidental discharge from unspecified firearms or gun, initial encounter: Secondary | ICD-10-CM | POA: Diagnosis not present

## 2022-12-11 DIAGNOSIS — M79652 Pain in left thigh: Secondary | ICD-10-CM

## 2022-12-12 ENCOUNTER — Telehealth: Payer: Self-pay

## 2022-12-12 NOTE — Transitions of Care (Post Inpatient/ED Visit) (Signed)
   12/12/2022  Name: Tyler Rowland MRN: [id number] DOB: 1975-12-22  Today's TOC FU Call Status: Today's TOC FU Call Status:: Unsuccessul Call (1st Attempt) Unsuccessful Call (1st Attempt) Date: 12/12/22  Attempted to reach the patient regarding the most recent Inpatient/ED visit.  Follow Up Plan: Additional outreach attempts will be made to reach the patient to complete the Transitions of Care (Post Inpatient/ED visit) call.   Signature Lisabeth Devoid, [place]

## 2022-12-20 ENCOUNTER — Telehealth: Payer: Self-pay

## 2022-12-20 NOTE — Transitions of Care (Post Inpatient/ED Visit) (Signed)
   12/20/2022  Name: Tyler Rowland MRN: [id number] DOB: [birth year]  Today's TOC FU Call Status: Today's TOC FU Call Status:: Unsuccessful Call (3rd Attempt) Unsuccessful Call (3rd Attempt) Date: 12/20/22  Attempted to reach the patient regarding the most recent Inpatient/ED visit.  Follow Up Plan: No further outreach attempts will be made at this time. We have been unable to contact the patient.  Signature YL,RMA

## 2022-12-24 ENCOUNTER — Emergency Department (HOSPITAL_BASED_OUTPATIENT_CLINIC_OR_DEPARTMENT_OTHER)
Admission: EM | Admit: 2022-12-24 | Discharge: 2022-12-25 | Disposition: A | Discharge status: Home / Self Care | Payer: BC Managed Care – PPO | Attending: Emergency Medicine | Admitting: Emergency Medicine

## 2022-12-24 ENCOUNTER — Emergency Department (HOSPITAL_BASED_OUTPATIENT_CLINIC_OR_DEPARTMENT_OTHER): Payer: BC Managed Care – PPO

## 2022-12-24 ENCOUNTER — Other Ambulatory Visit: Payer: Self-pay

## 2022-12-24 DIAGNOSIS — Z23 Encounter for immunization: Secondary | ICD-10-CM | POA: Insufficient documentation

## 2022-12-24 DIAGNOSIS — S67196A Crushing injury of right little finger, initial encounter: Secondary | ICD-10-CM | POA: Diagnosis not present

## 2022-12-24 DIAGNOSIS — Y990 Civilian activity done for income or pay: Secondary | ICD-10-CM | POA: Diagnosis not present

## 2022-12-24 DIAGNOSIS — S61216A Laceration without foreign body of right little finger without damage to nail, initial encounter: Secondary | ICD-10-CM | POA: Diagnosis not present

## 2022-12-24 DIAGNOSIS — S6991XA Unspecified injury of right wrist, hand and finger(s), initial encounter: Secondary | ICD-10-CM | POA: Diagnosis not present

## 2022-12-24 DIAGNOSIS — S62636B Displaced fracture of distal phalanx of right little finger, initial encounter for open fracture: Secondary | ICD-10-CM | POA: Insufficient documentation

## 2022-12-24 DIAGNOSIS — S62639B Displaced fracture of distal phalanx of unspecified finger, initial encounter for open fracture: Secondary | ICD-10-CM

## 2022-12-24 DIAGNOSIS — S60456A Superficial foreign body of right little finger, initial encounter: Secondary | ICD-10-CM | POA: Diagnosis not present

## 2022-12-24 DIAGNOSIS — W25XXXA Contact with sharp glass, initial encounter: Secondary | ICD-10-CM | POA: Insufficient documentation

## 2022-12-24 DIAGNOSIS — S6710XA Crushing injury of unspecified finger(s), initial encounter: Secondary | ICD-10-CM

## 2022-12-24 NOTE — ED Triage Notes (Signed)
POV from home, A&O x 4, GCS 15, amb to triage  While at work pt had piece of glass fall onto right pinky finger. Laceration noted to fingernail, bleeding controlled. 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

## 2022-12-25 ENCOUNTER — Telehealth: Payer: Self-pay

## 2022-12-25 ENCOUNTER — Encounter (HOSPITAL_BASED_OUTPATIENT_CLINIC_OR_DEPARTMENT_OTHER): Payer: Self-pay

## 2022-12-25 ENCOUNTER — Other Ambulatory Visit: Payer: Self-pay

## 2022-12-25 MED ORDER — CEPHALEXIN 500 MG PO CAPS: 500.0000 mg | ORAL_CAPSULE | Freq: Three times a day (TID) | ORAL | 0 refills | Status: AC

## 2022-12-25 MED ORDER — IBUPROFEN 600 MG PO TABS: 600.0000 mg | ORAL_TABLET | Freq: Four times a day (QID) | ORAL | 0 refills | Status: AC | PRN

## 2022-12-25 MED ORDER — ACETAMINOPHEN 325 MG PO TABS: 650.0000 mg | ORAL_TABLET | Freq: Four times a day (QID) | ORAL | 0 refills | Status: AC | PRN

## 2022-12-25 MED ORDER — TETANUS-DIPHTH-ACELL PERTUSSIS 5-2.5-18.5 LF-MCG/0.5 IM SUSY
0.5000 mL | PREFILLED_SYRINGE | Freq: Once | INTRAMUSCULAR | Status: AC
  Administered 2022-12-25: 0.5 mL via INTRAMUSCULAR
  Filled 2022-12-25: qty 0.5

## 2022-12-25 MED ORDER — LIDOCAINE HCL 2 % IJ SOLN
10.0000 mL | Freq: Once | INTRAMUSCULAR | Status: DC
  Filled 2022-12-25: qty 20

## 2022-12-25 MED ORDER — OXYCODONE HCL 5 MG PO TABS: 5.0000 mg | ORAL_TABLET | Freq: Four times a day (QID) | ORAL | 0 refills | Status: AC | PRN
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
## 2022-12-25 NOTE — Telephone Encounter (Signed)
Patient No Showed PV appt.  Called twice at 1:00 & 1:12pm and left message stating that he needed to call back and reschedule the PV by 5pm today or his colonoscopy would be cancelled.

## 2022-12-25 NOTE — ED Notes (Signed)
Wound wrapped and splint applied to right little finger.

## 2022-12-25 NOTE — ED Notes (Signed)
ED Provider at bedside. 

## 2022-12-25 NOTE — Discharge Instructions (Addendum)
It was a pleasure caring for you today in the emergency department.  Please return to the emergency department for any worsening or worrisome symptoms.  Please follow up with Dr Yehuda Budd (hand surgeon) in next week for recheck  Please keep the wound clean/dry, no immersion underwater until wound has healed. No hot tubs or swimming pools or lakes until wound healed. Keep splint on. Light duty recommended at work. Take antibiotics as prescribed. Come back if the pain becomes unbearable, you have a fever, pus coming out of wound, any worsening or worrisome symptoms

## 2022-12-25 NOTE — Transitions of Care (Post Inpatient/ED Visit) (Signed)
   12/25/2022  Name: Tyler Rowland MRN: [id number] DOB: June 01, 1976  Today's TOC FU Call Status: Today's TOC FU Call Status:: Successful TOC FU Call Competed TOC FU Call Complete Date: 12/25/22  Transition Care Management Follow-up Telephone Call Date of Discharge: 12/24/22 Discharge Facility: [facility] Type of Discharge: Inpatient Admission Primary Inpatient Discharge Diagnosis:: broken figure How have you been since you were released from the hospital?: Better Any questions or concerns?: No  Items Reviewed: Did you receive and understand the discharge instructions provided?: Yes Medications obtained,verified, and reconciled?: Yes (Medications Reviewed) Any new allergies since your discharge?: No Dietary orders reviewed?: No Do you have support at home?: Yes People in Home: sibling(s)  Medications Reviewed Today: Medications Reviewed Today     Reviewed by Marlyn Corporal, CMA (Certified Medical Assistant) on 12/25/22 at 1719  Med List Status: <None>   Medication Order Taking? Sig Documenting Provider Last Dose Status Informant  acetaminophen (TYLENOL) 325 MG tablet [id number] Yes Take 2 tablets (650 mg total) by mouth every 6 (six) hours as needed. Sloan Leiter, DO Taking Active   cephALEXin (KEFLEX) 500 MG capsule [id number] Yes Take 1 capsule (500 mg total) by mouth 3 (three) times daily for 7 days. Sloan Leiter, DO Taking Active   ibuprofen (ADVIL) 600 MG tablet [id number] Yes Take 1 tablet (600 mg total) by mouth every 6 (six) hours as needed. Sloan Leiter, DO Taking Active   oxyCODONE (ROXICODONE) 5 MG immediate release tablet [id number] Yes Take 1 tablet (5 mg total) by mouth every 6 (six) hours as needed for severe pain. Sloan Leiter, DO Taking Active   Patient not taking:  Discontinued 04/26/19 0600             Home Care and Equipment/Supplies: Were Home Health Services Ordered?: No Any new equipment or medical supplies ordered?: No  Functional  Questionnaire: Do you need assistance with bathing/showering or dressing?: No Do you need assistance with meal preparation?: No Do you need assistance with eating?: No Do you have difficulty maintaining continence: No Do you need assistance with getting out of bed/getting out of a chair/moving?: No Do you have difficulty managing or taking your medications?: No  Follow up appointments reviewed: PCP Follow-up appointment confirmed?: No MD Provider Line Number:[phone] Given: Yes Specialist Hospital Follow-up appointment confirmed?: Yes Follow-Up Specialty Provider:: ortho Do you need transportation to your follow-up appointment?: No Do you understand care options if your condition(s) worsen?: Yes-patient verbalized understanding    SIGNATURE Lisabeth Devoid, CMA

## 2022-12-26 NOTE — Telephone Encounter (Signed)
Patient rescheduled PV for 01/16/23 @8 :30am.

## 2023-01-11 ENCOUNTER — Ambulatory Visit: Payer: BC Managed Care – PPO | Admitting: Nurse Practitioner

## 2023-01-11 ENCOUNTER — Encounter: Payer: Self-pay | Admitting: Nurse Practitioner

## 2023-01-11 VITALS — BP 136/70 | HR 85 | Temp 98.3°F | Ht 74.0 in | Wt 241.6 lb

## 2023-01-11 DIAGNOSIS — U071 COVID-19: Secondary | ICD-10-CM

## 2023-01-11 DIAGNOSIS — J029 Acute pharyngitis, unspecified: Secondary | ICD-10-CM

## 2023-01-11 LAB — POC SOFIA 2 FLU + SARS ANTIGEN FIA
Influenza A, POC: NEGATIVE
Influenza B, POC: NEGATIVE
SARS Coronavirus 2 Ag: POSITIVE — AB

## 2023-01-11 MED ORDER — NIRMATRELVIR/RITONAVIR (PAXLOVID)TABLET
3.0000 | ORAL_TABLET | Freq: Two times a day (BID) | ORAL | 0 refills | Status: AC
Start: 2023-01-11 — End: 2023-01-16

## 2023-01-11 MED ORDER — BENZONATATE 100 MG PO CAPS
100.0000 mg | ORAL_CAPSULE | Freq: Four times a day (QID) | ORAL | 1 refills | Status: AC | PRN
Start: 2023-01-11 — End: 2024-01-11

## 2023-01-11 NOTE — Patient Instructions (Addendum)
Advised patient to take Vitamin C, D, Zinc.  Keep yourself hydrated with a lot of water and rest. Take Delsym for cough and Mucinex as need. Take Tylenol or pain reliever every 4-6 hours as needed for pain/fever/body ache. If you have elevated blood pressure, you can take OTC Corcidin. You can also take OTC oscillococcinum to help with your symptoms.  Educated patient if symptoms get worse or if she experiences any SOB, chest pain or pain in her legs to seek immediate emergency care. Continue to monitor your oxygen levels. Call us if you have any questions. Quarantine for 5 days if asymptomatic after 5 days continue quarantine for 10 days. Continue wearing mask after 5 days when around other people.
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
## 2023-01-16 ENCOUNTER — Ambulatory Visit (AMBULATORY_SURGERY_CENTER): Payer: BC Managed Care – PPO

## 2023-01-16 VITALS — Ht 74.0 in | Wt 241.0 lb

## 2023-01-16 DIAGNOSIS — Z1211 Encounter for screening for malignant neoplasm of colon: Secondary | ICD-10-CM

## 2023-01-16 MED ORDER — NA SULFATE-K SULFATE-MG SULF 17.5-3.13-1.6 GM/177ML PO SOLN
1.0000 | Freq: Once | ORAL | 0 refills | Status: AC
Start: 2023-01-16 — End: 2023-01-16

## 2023-01-16 NOTE — Assessment & Plan Note (Signed)
Advised patient to take Vitamin C, D, Zinc.  Keep yourself hydrated with a lot of water and rest. Take Delsym for cough and Mucinex as need. Take Tylenol or pain reliever every 4-6 hours as needed for pain/fever/body ache. If you have elevated blood pressure, you can take OTC Corcidin. You can also take OTC oscillococcinum to help with your symptoms.  Educated patient if symptoms get worse or if she experiences any SOB, chest pain or pain in her legs to seek immediate emergency care. Continue to monitor your oxygen levels. Call us if you have any questions. Quarantine for 5 days will need to remain quarantined for total of 10 days if continues to have symptoms, wear mask around others for total 10 days

## 2023-01-16 NOTE — Progress Notes (Signed)
No egg or soy allergy known to patient  No issues known to pt with past sedation with any surgeries or procedures Patient denies ever being told they had issues or difficulty with intubation  No FH of Malignant Hyperthermia Pt is not on diet pills Pt is not on  home 02  Pt is not on blood thinners  Pt denies issues with constipation  No A fib or A flutter Have any cardiac testing pending--no  LOA: independent  Prep: suprep   Patient's chart reviewed by Cathlyn Parsons CNRA prior to previsit and patient appropriate for the [facility].  Previsit completed and red dot placed by patient's name on their procedure day (on provider's schedule).     PV competed with patient. Prep instructions sent via mychart and home address. Goodrx coupon for [employer] provided to use for price reduction if needed.

## 2023-01-17 ENCOUNTER — Encounter: Payer: BC Managed Care – PPO | Admitting: Internal Medicine

## 2023-02-01 ENCOUNTER — Encounter: Payer: Self-pay | Admitting: Internal Medicine

## 2023-02-15 ENCOUNTER — Encounter: Payer: BC Managed Care – PPO | Admitting: Internal Medicine
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
## 2023-02-28 ENCOUNTER — Encounter: Payer: BC Managed Care – PPO | Admitting: Nurse Practitioner

## 2023-02-28 DIAGNOSIS — Z0000 Encounter for general adult medical examination without abnormal findings: Secondary | ICD-10-CM

## 2023-02-28 DIAGNOSIS — G8929 Other chronic pain: Secondary | ICD-10-CM

## 2023-03-20 ENCOUNTER — Encounter: Payer: BC Managed Care – PPO | Admitting: Family Medicine

## 2023-03-27 ENCOUNTER — Encounter: Payer: BC Managed Care – PPO | Admitting: Internal Medicine

## 2023-03-27 ENCOUNTER — Telehealth: Payer: Self-pay | Admitting: Internal Medicine

## 2023-03-27 NOTE — Telephone Encounter (Signed)
Good Morning Dr. Leonides Schanz,  I called this patient this morning at 7:54 he stated he had forgotten about procedure.  He will call back to reschedule.  I will NO SHOW patient --BCBS

## 2023-05-08 ENCOUNTER — Encounter: Payer: BC Managed Care – PPO | Admitting: Family Medicine

## 2023-05-10 ENCOUNTER — Encounter: Payer: Self-pay | Admitting: Family Medicine
